# Patient Record
Sex: Male | Born: 1945 | Race: White | Hispanic: No | Marital: Married | Smoking: Former smoker
Health system: Southern US, Community
[De-identification: ages and names within clinical notes are randomized; demographics above are authoritative.]

## PROBLEM LIST (undated history)

## (undated) DIAGNOSIS — E46 Unspecified protein-calorie malnutrition: Secondary | ICD-10-CM

## (undated) DIAGNOSIS — I714 Abdominal aortic aneurysm, without rupture, unspecified: Secondary | ICD-10-CM

## (undated) DIAGNOSIS — C259 Malignant neoplasm of pancreas, unspecified: Secondary | ICD-10-CM

## (undated) DIAGNOSIS — H409 Unspecified glaucoma: Secondary | ICD-10-CM

## (undated) DIAGNOSIS — K5909 Other constipation: Secondary | ICD-10-CM

## (undated) DIAGNOSIS — I10 Essential (primary) hypertension: Secondary | ICD-10-CM

## (undated) HISTORY — DX: Unspecified glaucoma: H40.9

## (undated) HISTORY — DX: Unspecified protein-calorie malnutrition: E46

## (undated) HISTORY — PX: EYE SURGERY: SHX253

## (undated) HISTORY — DX: Malignant neoplasm of pancreas, unspecified: C25.9

## (undated) HISTORY — DX: Essential (primary) hypertension: I10

## (undated) HISTORY — DX: Other constipation: K59.09

## (undated) HISTORY — DX: Abdominal aortic aneurysm, without rupture: I71.4

## (undated) HISTORY — DX: Abdominal aortic aneurysm, without rupture, unspecified: I71.40

---

## 2016-03-10 ENCOUNTER — Encounter: Payer: Self-pay | Admitting: Gastroenterology

## 2016-03-28 ENCOUNTER — Inpatient Hospital Stay (HOSPITAL_COMMUNITY)
Admission: AD | Admit: 2016-03-28 | Discharge: 2016-04-01 | DRG: 435 | Disposition: A | Discharge status: Home / Self Care | Payer: Medicare HMO | Source: Other Acute Inpatient Hospital | Attending: Internal Medicine | Admitting: Internal Medicine

## 2016-03-28 ENCOUNTER — Encounter (HOSPITAL_COMMUNITY): Payer: Self-pay | Admitting: Internal Medicine

## 2016-03-28 DIAGNOSIS — K859 Acute pancreatitis without necrosis or infection, unspecified: Secondary | ICD-10-CM | POA: Diagnosis not present

## 2016-03-28 DIAGNOSIS — C259 Malignant neoplasm of pancreas, unspecified: Secondary | ICD-10-CM | POA: Diagnosis present

## 2016-03-28 DIAGNOSIS — R911 Solitary pulmonary nodule: Secondary | ICD-10-CM | POA: Diagnosis not present

## 2016-03-28 DIAGNOSIS — Z8249 Family history of ischemic heart disease and other diseases of the circulatory system: Secondary | ICD-10-CM

## 2016-03-28 DIAGNOSIS — N400 Enlarged prostate without lower urinary tract symptoms: Secondary | ICD-10-CM | POA: Diagnosis present

## 2016-03-28 DIAGNOSIS — H409 Unspecified glaucoma: Secondary | ICD-10-CM | POA: Diagnosis not present

## 2016-03-28 DIAGNOSIS — G479 Sleep disorder, unspecified: Secondary | ICD-10-CM | POA: Diagnosis present

## 2016-03-28 DIAGNOSIS — E860 Dehydration: Secondary | ICD-10-CM | POA: Diagnosis not present

## 2016-03-28 DIAGNOSIS — E440 Moderate protein-calorie malnutrition: Secondary | ICD-10-CM | POA: Diagnosis present

## 2016-03-28 DIAGNOSIS — K5730 Diverticulosis of large intestine without perforation or abscess without bleeding: Secondary | ICD-10-CM | POA: Diagnosis not present

## 2016-03-28 DIAGNOSIS — K769 Liver disease, unspecified: Secondary | ICD-10-CM | POA: Diagnosis not present

## 2016-03-28 DIAGNOSIS — I7409 Other arterial embolism and thrombosis of abdominal aorta: Secondary | ICD-10-CM | POA: Diagnosis present

## 2016-03-28 DIAGNOSIS — R5383 Other fatigue: Secondary | ICD-10-CM | POA: Diagnosis present

## 2016-03-28 DIAGNOSIS — Z882 Allergy status to sulfonamides status: Secondary | ICD-10-CM

## 2016-03-28 DIAGNOSIS — Z681 Body mass index (BMI) 19 or less, adult: Secondary | ICD-10-CM

## 2016-03-28 DIAGNOSIS — F419 Anxiety disorder, unspecified: Secondary | ICD-10-CM | POA: Diagnosis not present

## 2016-03-28 DIAGNOSIS — G893 Neoplasm related pain (acute) (chronic): Secondary | ICD-10-CM | POA: Diagnosis not present

## 2016-03-28 DIAGNOSIS — C787 Secondary malignant neoplasm of liver and intrahepatic bile duct: Secondary | ICD-10-CM | POA: Diagnosis present

## 2016-03-28 DIAGNOSIS — C7951 Secondary malignant neoplasm of bone: Secondary | ICD-10-CM | POA: Diagnosis present

## 2016-03-28 DIAGNOSIS — E785 Hyperlipidemia, unspecified: Secondary | ICD-10-CM | POA: Diagnosis not present

## 2016-03-28 DIAGNOSIS — Z823 Family history of stroke: Secondary | ICD-10-CM

## 2016-03-28 DIAGNOSIS — C7800 Secondary malignant neoplasm of unspecified lung: Secondary | ICD-10-CM | POA: Diagnosis present

## 2016-03-28 DIAGNOSIS — R634 Abnormal weight loss: Secondary | ICD-10-CM | POA: Diagnosis not present

## 2016-03-28 DIAGNOSIS — K5909 Other constipation: Secondary | ICD-10-CM | POA: Diagnosis present

## 2016-03-28 DIAGNOSIS — Z79899 Other long term (current) drug therapy: Secondary | ICD-10-CM | POA: Diagnosis not present

## 2016-03-28 DIAGNOSIS — R1013 Epigastric pain: Secondary | ICD-10-CM | POA: Diagnosis not present

## 2016-03-28 DIAGNOSIS — K869 Disease of pancreas, unspecified: Secondary | ICD-10-CM | POA: Diagnosis not present

## 2016-03-28 DIAGNOSIS — I10 Essential (primary) hypertension: Secondary | ICD-10-CM | POA: Diagnosis present

## 2016-03-28 DIAGNOSIS — I714 Abdominal aortic aneurysm, without rupture, unspecified: Secondary | ICD-10-CM | POA: Diagnosis present

## 2016-03-28 DIAGNOSIS — Z87891 Personal history of nicotine dependence: Secondary | ICD-10-CM

## 2016-03-28 DIAGNOSIS — K8689 Other specified diseases of pancreas: Secondary | ICD-10-CM | POA: Diagnosis present

## 2016-03-28 MED ORDER — BISACODYL 10 MG RE SUPP: 10.0000 mg | Freq: Every day | RECTAL | Status: DC | PRN

## 2016-03-28 MED ORDER — ONDANSETRON HCL 4 MG/2ML IJ SOLN: 4.0000 mg | Freq: Four times a day (QID) | INTRAMUSCULAR | Status: DC | PRN

## 2016-03-28 MED ORDER — POLYETHYLENE GLYCOL 3350 17 G PO PACK: 17.0000 g | PACK | Freq: Every day | ORAL | Status: DC | PRN

## 2016-03-28 MED ORDER — SODIUM CHLORIDE 0.9 % IV SOLN
INTRAVENOUS | Status: AC
  Administered 2016-03-29: 01:00:00 via INTRAVENOUS

## 2016-03-28 MED ORDER — SENNA 8.6 MG PO TABS
1.0000 | ORAL_TABLET | Freq: Two times a day (BID) | ORAL | Status: DC
  Administered 2016-03-29 – 2016-04-01 (×6): 8.6 mg via ORAL
  Filled 2016-03-28 (×6): qty 1

## 2016-03-28 MED ORDER — HYDROMORPHONE HCL 1 MG/ML IJ SOLN
0.5000 mg | INTRAMUSCULAR | Status: DC | PRN
  Administered 2016-03-29 – 2016-03-31 (×11): 0.5 mg via INTRAVENOUS
  Filled 2016-03-28 (×12): qty 1

## 2016-03-28 MED ORDER — LISINOPRIL 20 MG PO TABS
20.0000 mg | ORAL_TABLET | Freq: Every day | ORAL | Status: DC
  Administered 2016-03-29 – 2016-04-01 (×4): 20 mg via ORAL
  Filled 2016-03-28 (×4): qty 1

## 2016-03-28 MED ORDER — ACETAMINOPHEN 650 MG RE SUPP: 650.0000 mg | Freq: Four times a day (QID) | RECTAL | Status: DC | PRN

## 2016-03-28 MED ORDER — DORZOLAMIDE HCL 2 % OP SOLN
1.0000 [drp] | Freq: Three times a day (TID) | OPHTHALMIC | Status: DC
  Administered 2016-03-29 – 2016-04-01 (×11): 1 [drp] via OPHTHALMIC
  Filled 2016-03-28: qty 10

## 2016-03-28 MED ORDER — ONDANSETRON HCL 4 MG PO TABS
4.0000 mg | ORAL_TABLET | Freq: Four times a day (QID) | ORAL | Status: DC | PRN
  Filled 2016-03-28: qty 1

## 2016-03-28 MED ORDER — ACETAMINOPHEN 325 MG PO TABS: 650.0000 mg | ORAL_TABLET | Freq: Four times a day (QID) | ORAL | Status: DC | PRN

## 2016-03-28 NOTE — H&P (Signed)
Ricky Gay [id number] DOB: 03/22/1946 DOA: 03/28/2016     PCP: No primary care provider on file.   Outpatient Specialists: [facility] in [place]   Patient coming from:   home Lives   With family    Chief Complaint: Fatigue and weight loss significant abdominal pain  HPI: Ricky Gay is a 70 y.o. male with medical history significant of HTN, Glaucoma, hyperlipidemia    Presented with complaints of 2 months of weight loss worsening fatigue significant epigastric pain it has become so severe he presented to emergency department this has been worked up by colonoscopy which was negative. He originally was seen in [place] plain films was worrisome for constipation he was given medication for that but did not improve She endorses losing between 15-20 pounds over past 2 months. He presented to [facility]. 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 Bones (possibly other source) and lungs (single lung nodule). CT scan also showed a a a measuring 3.9 x 3.7 cm the posterior moderate thrombus. Vascular has been consulted Dr. Doren Custard stated is no current indication for anticoagulation. Patient was transferred to [facility] long for further workup and evaluation by oncology.      Hospitalist was called for admission for newly diagnosed pancreatic cancer diffuse metastases and new diagnosis of AAA with thrombosis  Review of Systems:    Pertinent positives include:  fatigue, weight loss   Constitutional:  No weight loss, night sweats, Fevers, chills, HEENT:  No headaches, Difficulty swallowing,Tooth/dental problems,Sore throat,  No sneezing, itching, ear ache, nasal congestion, post nasal drip,  Cardio-vascular:  No chest pain, Orthopnea, PND, anasarca, dizziness, palpitations.no Bilateral lower extremity swelling  GI:  No heartburn, indigestion, abdominal pain, nausea, vomiting, diarrhea, change in bowel habits,  loss of appetite, melena, blood in stool, hematemesis Resp:  no shortness of breath at rest. No dyspnea on exertion, No excess mucus, no productive cough, No non-productive cough, No coughing up of blood.No change in color of mucus.No wheezing. Skin:  no rash or lesions. No jaundice GU:  no dysuria, change in color of urine, no urgency or frequency. No straining to urinate.  No flank pain.  Musculoskeletal:  No joint pain or no joint swelling. No decreased range of motion. No back pain.  Psych:  No change in mood or affect. No depression or anxiety. No memory loss.  Neuro: no localizing neurological complaints, no tingling, no weakness, no double vision, no gait abnormality, no slurred speech, no confusion  As per HPI otherwise 10 point review of systems negative.   Past Medical History: No past medical history on file. No past surgical history on file.   Social History:  Ambulatory  Independently     has no tobacco, alcohol, and drug history on file.  Allergies:   Allergies  Allergen Reactions  . Bactrim [Sulfamethoxazole-Trimethoprim] Itching       Family History:    Family History  Problem Relation Age of Onset  . Dementia Mother   . CAD Father   . Stroke Father   . Cancer Other   . Diabetes Neg Hx     Medications: Prior to Admission medications   Medication Sig Start Date End Date Taking? Authorizing Provider  atorvastatin (LIPITOR) 10 MG tablet Take 10 mg by mouth daily. 01/22/16   Historical Provider, MD  dicyclomine (BENTYL) 10 MG capsule Take 10 mg by mouth 3 (three) times daily as needed. For abdominal  cramping? 03/14/16   Historical Provider, MD  dorzolamide (TRUSOPT) 2 % ophthalmic solution  03/27/16   Historical Provider, MD  lisinopril-hydrochlorothiazide (PRINZIDE,ZESTORETIC) 20-12.5 MG tablet Take 1 tablet by mouth daily. 01/22/16   Historical Provider, MD    Physical Exam: No data found.   1. General:  in No Acute distress 2. Psychological:  Alert and   Oriented 3. Head/ENT:     Dry Mucous Membranes                          Head Non traumatic, neck supple                            Poor Dentition 4. SKIN:   decreased Skin turgor,  Skin clean Dry and intact no rash 5. Heart: Regular rate and rhythm no Murmur, Rub or gallop 6. Lungs:  Clear to auscultation bilaterally, no wheezes or crackles   7. Abdomen: Soft,  Some epigastric tenderness, Non distended 8. Lower extremities: no clubbing, cyanosis, or edema 9. Neurologically Grossly intact, moving all 4 extremities equally 10. MSK: Normal range of motion   body mass index is unknown because there is no height or weight on file.  Labs on Admission:   Labs on Admission: I have personally reviewed following labs and imaging studies  CBC: No results for input(s): WBC, NEUTROABS, HGB, HCT, MCV, PLT in the last 168 hours. Basic Metabolic Panel: No results for input(s): NA, K, CL, CO2, GLUCOSE, BUN, CREATININE, CALCIUM, MG, PHOS in the last 168 hours. GFR: CrCl cannot be calculated (Unknown ideal weight.). Liver Function Tests: No results for input(s): AST, ALT, ALKPHOS, BILITOT, PROT, ALBUMIN in the last 168 hours. No results for input(s): LIPASE, AMYLASE in the last 168 hours. No results for input(s): AMMONIA in the last 168 hours. Coagulation Profile: No results for input(s): INR, PROTIME in the last 168 hours. Cardiac Enzymes: No results for input(s): CKTOTAL, CKMB, CKMBINDEX, TROPONINI in the last 168 hours. BNP (last 3 results) No results for input(s): PROBNP in the last 8760 hours. HbA1C: No results for input(s): HGBA1C in the last 72 hours. CBG: No results for input(s): GLUCAP in the last 168 hours. Lipid Profile: No results for input(s): CHOL, HDL, LDLCALC, TRIG, CHOLHDL, LDLDIRECT in the last 72 hours. Thyroid Function Tests: No results for input(s): TSH, T4TOTAL, FREET4, T3FREE, THYROIDAB in the last 72 hours. Anemia Panel: No results for input(s):  VITAMINB12, FOLATE, FERRITIN, TIBC, IRON, RETICCTPCT in the last 72 hours. Urine analysis: No results found for: COLORURINE, APPEARANCEUR, LABSPEC, PHURINE, GLUCOSEU, HGBUR, BILIRUBINUR, KETONESUR, PROTEINUR, UROBILINOGEN, NITRITE, LEUKOCYTESUR Sepsis Labs: @LABRCNTIP (procalcitonin:4,lacticidven:4) )No results found for this or any previous visit (from the past 240 hour(s)).   Labs from [place] glucose 102 creatinine 0.98 calcium 9.4 0.3 ALKALINE PHOSPHATASE 163 ALBUMIN 4.3 BILIRUBIN 0.8 SODIUM 133 ALT NORMAL POTASSIUM 3.6 ANION GAP OF 19 BICARBONATE 24.2, WBC 12 AND 0.5 HEMOGLOBIN 15.1 PLATELETS 182 UA no evidence of UTI  No results found for: HGBA1C  CrCl cannot be calculated (Unknown ideal weight.).  BNP (last 3 results) No results for input(s): PROBNP in the last 8760 hours.   ECG REPORT  Independently reviewed Rate: 77  Rhythm: Sinus rhythm with PACs incomplete left bundle branch block ST&T Change: No acute ischemic changes  QTC 472  There were no vitals filed for this visit.   Cultures: No results found for: [place], [place], [place], [place]  on Admission: CT at [place]: mass occupying body and tail of pancreas measuring 9.3 x 4.7 cm surrounding segment of's. Mesenteric artery widespread metastatic disease throughout the liver are just lesion measuring 5.3 x 4.8 cm. pancreatic mass extending to the splenic hilum but does not invade spleen. Aneurysmal dilatation of the distal abdominal elective maximum transverse diameter 3.9 x 3.7 cm moderate thrombus in the posterior periphery of aneurysm aneurysm terminates slightly above bifurcation arises slightly above renal arteries no evidence of rupture small sclerotic fossae at multiple sites in the pelvis and proximal femur O lead to 2 lesions small nodular lesion in the right lung base small metastatic focus small lesion in the posterior spleen potentially could represent small metastatic focus. Sclerotic  lesions in the bone more worrisome for possible prostate carcinoma Chart has been reviewed    Assessment/Plan   70 y.o. male with medical history significant of HTN, Glaucoma, hyperlipidemia here with nearly diagnosed pancreatic mass with metastases to liver and possibly spleen and lungs  Present on Admission:  . Pancreatic cancer metastasized to liver [facility]) - pancreatic mass most likely pancreatic cancer with evidence of large metastasis to the liver. Was transferred from [facility] for biopsy and further evaluation. Please call IR consult in a.m. to determine if biopsy would be possible meanwhile we will control pain.  Marland Kitchen HTN (hypertension) Indocin per hold hydrochlorothiazide patient has been dehydrated from not eating and drinking  . Abdominal pain, epigastric secondary to pancreatic mass most likely pain due to malignancy while nothing by mouth treated Dilaudid to see him to help the best for his pain   new diagnosis of AAA with partial thrombosis - vascular surgery does not feel that he needs anticoagulation at this point. Please call vascular surgeon the morning for full consult  Other plan as per orders.  DVT prophylaxis:  SCD    Code Status:  FULL CODE as per patient   Family Communication:   Family  at  Bedside  plan of care was discussed with   Son Sunil Zepp [phone], Daughter in [place] Aran [phone] ,  Wife Vivien Rota  phone Number [phone] Disposition Plan:    To home once workup is complete and patient is stable   Consults called: epic order for IR consult in place please call in AM    Admission status: inpatient     Level of care      medical floor        [place] 03/28/2016, 11:37 PM    [facility]  Pager [phone]   after 2 AM please page floor coverage PA If 7AM-7PM, please contact the day team taking care of the patient  [email or web address]  Password [email or web address]

## 2016-03-29 ENCOUNTER — Encounter (HOSPITAL_COMMUNITY): Payer: Self-pay | Admitting: Physician Assistant

## 2016-03-29 DIAGNOSIS — C787 Secondary malignant neoplasm of liver and intrahepatic bile duct: Secondary | ICD-10-CM

## 2016-03-29 DIAGNOSIS — I7410 Embolism and thrombosis of unspecified parts of aorta: Secondary | ICD-10-CM

## 2016-03-29 DIAGNOSIS — I10 Essential (primary) hypertension: Secondary | ICD-10-CM

## 2016-03-29 LAB — CBC
HCT: 40.8 % (ref 39.0–52.0)
Hemoglobin: 13.8 g/dL (ref 13.0–17.0)
MCH: 29.4 pg (ref 26.0–34.0)
MCHC: 33.8 g/dL (ref 30.0–36.0)
MCV: 87 fL (ref 78.0–100.0)
PLATELETS: 138 10*3/uL — AB (ref 150–400)
RBC: 4.69 MIL/uL (ref 4.22–5.81)
RDW: 13.1 % (ref 11.5–15.5)
WBC: 7.8 10*3/uL (ref 4.0–10.5)

## 2016-03-29 LAB — TSH: TSH: 2.981 u[IU]/mL (ref 0.350–4.500)

## 2016-03-29 LAB — COMPREHENSIVE METABOLIC PANEL
ALK PHOS: 122 U/L (ref 38–126)
ALT: 34 U/L (ref 17–63)
AST: 37 U/L (ref 15–41)
Albumin: 4 g/dL (ref 3.5–5.0)
Anion gap: 8 (ref 5–15)
BUN: 15 mg/dL (ref 6–20)
CALCIUM: 8.7 mg/dL — AB (ref 8.9–10.3)
CO2: 25 mmol/L (ref 22–32)
CREATININE: 0.96 mg/dL (ref 0.61–1.24)
Chloride: 100 mmol/L — ABNORMAL LOW (ref 101–111)
GFR calc non Af Amer: >60 mL/min (ref >60)
Glucose, Bld: 82 mg/dL (ref 65–99)
Potassium: 3.2 mmol/L — ABNORMAL LOW (ref 3.5–5.1)
SODIUM: 133 mmol/L — AB (ref 135–145)
Total Bilirubin: 0.8 mg/dL (ref 0.3–1.2)
Total Protein: 6.7 g/dL (ref 6.5–8.1)

## 2016-03-29 LAB — PROTIME-INR
INR: 1.27 (ref 0.00–1.49)
PROTHROMBIN TIME: 15.6 s — AB (ref 11.6–15.2)

## 2016-03-29 LAB — PHOSPHORUS: PHOSPHORUS: 3.4 mg/dL (ref 2.5–4.6)

## 2016-03-29 LAB — LIPASE, BLOOD: LIPASE: 58 U/L — AB (ref 11–51)

## 2016-03-29 LAB — MAGNESIUM: Magnesium: 1.8 mg/dL (ref 1.7–2.4)

## 2016-03-29 MED ORDER — CETYLPYRIDINIUM CHLORIDE 0.05 % MT LIQD
7.0000 mL | Freq: Two times a day (BID) | OROMUCOSAL | Status: DC
  Administered 2016-03-29 – 2016-04-01 (×7): 7 mL via OROMUCOSAL

## 2016-03-29 NOTE — Progress Notes (Signed)
Brief visit w/pt's wife, friends and son. Pt was unavailable (in restroom). Young offered a return visit and to request assistance if needed prior to next visit. Please page if support is needed.  [place], M.Div.   03/29/16 1200  Clinical Encounter Type  Visited With Family;Patient not available

## 2016-03-29 NOTE — H&P (Signed)
Chief Complaint: Fatigue Weight loss  Referring Physician(s): Toy Baker   Supervising Physician: Arne Cleveland  Patient Status: Inpatient  History of Present Illness: Ricky Gay is a 70 y.o. male who presented with complaints of 2 months of weight loss worsening fatigue.  He has had significant epigastric pain.    It became so severe that he presented to emergency department. Colonoscopy was negative.  He was originally seen in [place]. Abd films there were worrisome for constipation he was given medication for that but did not improve   He reports he has lost 15-20 pounds over past 2 months.   He then presented to [facility].   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    There is also some bony mets and enlarged prostate.   CT scan also showed an abdominal aortic aneurysm measuring 3.9 x 3.7 cm with moderate thrombus.   He was transferred to [facility] long for further workup and evaluation by oncology.  We are asked to evaluate him for image guided biopsy.  History reviewed. No pertinent past medical history.  History reviewed. No pertinent past surgical history.  Allergies: Bactrim  Medications: Prior to Admission medications   Medication Sig Start Date End Date Taking? Authorizing Provider  atorvastatin (LIPITOR) 10 MG tablet Take 10 mg by mouth daily. 01/22/16  Yes Historical Provider, MD  dicyclomine (BENTYL) 10 MG capsule Take 10 mg by mouth 3 (three) times daily as needed. For abdominal cramping 03/14/16  Yes Historical Provider, MD  dorzolamide (TRUSOPT) 2 % ophthalmic solution Place 1 drop into both eyes 3 (three) times daily.  03/27/16  Yes Historical Provider, MD  lisinopril-hydrochlorothiazide (PRINZIDE,ZESTORETIC) 20-12.5 MG tablet Take 1 tablet by mouth daily. 01/22/16  Yes Historical Provider, MD     Family History  Problem Relation Age of  Onset  . Dementia Mother   . CAD Father   . Stroke Father   . Cancer Other   . Diabetes Neg Hx     Social History   Social History  . Marital Status: Married    Spouse Name: N/A  . Number of Children: N/A  . Years of Education: N/A   Social History Main Topics  . Smoking status: Former Research scientist (life sciences)  . Smokeless tobacco: None  . Alcohol Use: Yes     Comment: once a week  . Drug Use: No  . Sexual Activity: Not Asked   Other Topics Concern  . None   Social History Narrative     Review of Systems: A 12 point ROS discussed  Review of Systems  Constitutional: Positive for activity change, appetite change, fatigue and unexpected weight change. Negative for fever and chills.  HENT: Negative.   Respiratory: Negative for cough, shortness of breath and wheezing.   Cardiovascular: Negative for chest pain.  Gastrointestinal: Positive for abdominal pain.  Genitourinary: Negative.   Musculoskeletal: Negative.   Skin: Negative.   Neurological: Negative.   Hematological: Negative.   Psychiatric/Behavioral: Negative.     Vital Signs: BP 135/74 mmHg  Pulse 53  Temp(Src) 98 F (36.7 C) (Oral)  Resp 18  Ht 5\' 8"  (1.727 m)  Wt 119 lb (53.978 kg)  BMI 18.10 kg/m2  SpO2 99%  Physical Exam  Constitutional: He is oriented to person, place, and time. He appears well-developed and well-nourished.  HENT:  Head: Normocephalic and atraumatic.  Eyes: EOM are normal.  Neck: Neck supple.  Cardiovascular: Normal rate, regular rhythm and normal heart sounds.   No murmur heard. Pulmonary/Chest: Effort normal and breath sounds normal.  Abdominal: Soft. Bowel sounds are normal. There is tenderness.  Musculoskeletal: Normal range of motion.  Neurological: He is alert and oriented to person, place, and time.  Skin: Skin is warm and dry.  Psychiatric: He has a normal mood and affect. His behavior is normal. Judgment and thought content normal.    Mallampati Score:  MD Evaluation Airway:  WNL Heart: WNL Abdomen: WNL Chest/ Lungs: WNL ASA  Classification: 3 Mallampati/Airway Score: One  Imaging: CLINICAL DATA: Abdominal pain  EXAM: CT ABDOMEN AND PELVIS WITH CONTRAST  TECHNIQUE: Multidetector CT imaging of the abdomen and pelvis was performed using the standard protocol following bolus administration of intravenous contrast. Oral contrast was also administered.  CONTRAST: 100 mL Isovue 370 nonionic  COMPARISON: None.  FINDINGS: Lower chest: On axial slice 16 series 5, there is a nodular opacity in the posterior segment of the right lower lobe measuring 6 x 5 mm. Lung bases otherwise are clear.  Hepatobiliary: Widespread metastatic disease is noted throughout the liver. Liver lesions range in size from as small as 4 mm to as large as 5.3 x 4.8 cm. This lesion is noted in the anterior segment of the right lobe. All lobes in segments of the liver are involved. The gallbladder wall is not appreciably thickened. There is no biliary duct dilatation.  Pancreas: There is a mass occupying most of the body and tail of the pancreas measuring 9.3 x 4.7 cm. The pancreatic head and uncinate process appear unremarkable. There is no appreciable pancreatic duct dilatation. There is mild mesenteric thickening adjacent to the body and tail the pancreas, suggesting that this mass has an inflammatory component. This mass surrounds a segment of the superior mesenteric artery.  Spleen: There is an area of decreased attenuation in the posterior spleen measuring 1.0 x 0.7 cm. No other focal splenic lesion is evident. Note that the mass arising from the pancreas extends to the splenic hilum but but does not frankly invade the spleen.  Adrenals/Urinary Tract: Adrenals appear unremarkable bilaterally. The pancreatic mass abuts the lateral left adrenal but does not overly invade the left adrenal. There is a 1.2 x 1.2 cm cyst in the medial upper pole right kidney. There is a  parapelvic cyst in the left kidney measuring 2.3 x 1.8 cm. There is no hydronephrosis on either side. There is a 1 mm calculus in the lower pole of the left kidney. No other renal calculi are identified. There is no ureteral calculus on either side. Urinary bladder is midline with wall thickness within normal limits.  Stomach/Bowel: There are multiple sigmoid diverticula without diverticulitis. There is no bowel wall or mesenteric thickening. No bowel obstruction. No free air or portal venous air.  Vascular/Lymphatic: There is aneurysmal dilatation of the distal abdominal aorta with a maximum transverse diameter of 3.9 x 3.7 cm. There is moderate thrombus in the posterior periphery of this aneurysm. The aneurysm terminates slightly above the bifurcation. It arises slightly above the renal arteries. There is no periaortic fluid. Vascular calcification is noted in both common and external iliac arteries. There is no adenopathy in the abdomen or pelvis.  Reproductive: Prostate is mildly enlarged. Seminal vesicles appear normal. No pelvic mass is identified. No pelvic fluid collection evident.  Other: There is no periappendiceal region inflammation. There is a metallic foreign body in the cecum. There is no abscess or ascites in  the abdomen or pelvis.  Musculoskeletal: There is degenerative change with disc space narrowing at L4-5. No lytic or destructive bone lesions are evident. Small sclerotic foci are noted at multiple sites in the pelvis and proximal femurs. No intramuscular or abdominal wall lesion.  IMPRESSION: Large pancreatic mass which inflitrates the pancreas and immediately adjacent structures. This lesion extends to the splenic hilum but does not frankly invade the spleen. This lesion extends to the level of the lateral left adrenal gland without adrenal gland invasion. There is no appreciable pancreatic duct dilatation. There are extensive liver metastases. A small  nodular lesion in the right lung base may represent a small metastatic focus. A small lesion in the posterior spleen potentially could represent a small metastatic focus as well.  Multiple small sclerotic foci in bone are noted. It is unusual for pancreatic carcinoma to present with sclerotic metastases. Prostate is enlarged, it is possible that there is prostate carcinoma present as well as pancreatic carcinoma.  Multiple sigmoid diverticular noted without diverticulitis. No bowel obstruction. No abscess.  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 There is associated atherosclerotic calcification in the aorta and iliac arteries.  No bowel obstruction. No abscess evident.   Electronically Signed By: Lowella Grip III M.D. On: 03/28/2016 14:44    Labs:  CBC:  Recent Labs  03/29/16 0426  WBC 7.8  HGB 13.8  HCT 40.8  PLT 138*    COAGS:  Recent Labs  03/29/16 0426  INR 1.27    BMP:  Recent Labs  03/29/16 0426  NA 133*  K 3.2*  CL 100*  CO2 25  GLUCOSE 82  BUN 15  CALCIUM 8.7*  CREATININE 0.96  GFRNONAA >60  GFRAA >60    LIVER FUNCTION TESTS:  Recent Labs  03/29/16 0426  BILITOT 0.8  AST 37  ALT 34  ALKPHOS 122  PROT 6.7  ALBUMIN 4.0    TUMOR MARKERS: No results for input(s): AFPTM, CEA, CA199, CHROMGRNA in the last 8760 hours.  Assessment and Plan:  Pancreatic mass with liver mets, lung nodule. Bone mets with enlarged prostate.  Will proceed with ultrasound guided biopsy of liver mass on Monday 03/31/2016.  Risks and Benefits discussed with the patient including, but not limited to bleeding, infection, damage to adjacent structures or low yield requiring additional tests.  All of the patient's questions were answered, patient is agreeable to proceed. Consent signed and in chart.  Thank you for this interesting consult.  I  greatly enjoyed meeting Ricky Gay and look forward to participating in their care.  A copy of this report was sent to the requesting provider on this date.  Electronically Signed: Murrell Redden PA-C 03/29/2016, 1:32 PM   I spent a total of 40 Minutes in face to face in clinical consultation, greater than 50% of which was counseling/coordinating care for image guided biopsy of liver mass.

## 2016-03-29 NOTE — Progress Notes (Signed)
PROGRESS NOTE  Bon Reason [id number] DOB: 1945/12/20 DOA: 03/28/2016 PCP: No primary care provider on file.  HPI/Recap of past 28 hours: 70 year old male with past mental history of hypertension and hyperlipidemia with 2 months of abdominal pain and weight loss presented to [facility] on 6/30 where CT scan revealed masses in body and tail of pancreas plus what looked to be liver metastases and also posterior thrombus in abdominal aortic aneurysm. Patient admitted for further workup and pain control.  Patient seen on floor. Midepigastric Pain starting to feel a little bit better. No shortness of breath or chest pain.   Assessment/Plan: Active Problems:   Pancreatic cancer metastasized to liver [facility]): Plan for interventional radiology for liver biopsy on Monday 7/3. In the interim, work on pain control. Nutrition to see given concerns for malnutrition.   HTN (hypertension): Blood pressure stable, continue home medications   Abdominal pain, epigastric: Likely secondary to intermittent episodes of pancreatitis. Lipase fortunately close to normal. Abdominal aortic aneurysm with posterior thrombus: Looks to be less than acute. We'll confirm with vascular surgery to his phone consulted last night and confirm patient does not need to be on anticoagulation   Code Status: Full code   Family Communication: Wife and son at the bedside   Disposition Plan: Likely here for several days pending results of biopsy    Consultants:  Interventional radiology   Procedures:  Planned liver biopsy   Antimicrobials:  None   DVT prophylaxis: SCDs   Objective: Filed Vitals:   03/28/16 2128 03/29/16 0512 03/29/16 1016  BP: 124/86 125/76 135/74  Pulse: 75 72 53  Temp: 97.8 F (36.6 C) 98 F (36.7 C)   TempSrc: Oral Oral   Resp: 20 18   Height: 5\' 8"  (1.727 m)    Weight: 53.978 kg (119 lb)    SpO2: 100% 99%     Intake/Output Summary (Last 24 hours) at 03/29/16 1236 Last data  filed at 03/29/16 0513  Gross per 24 hour  Intake    464 ml  Output    300 ml  Net    164 ml   Filed Weights   03/28/16 2128  Weight: 53.978 kg (119 lb)    Exam:   General:  Alert and oriented 3, no acute distress   Cardiovascular: Regular rate and rhythm, S1-S2   Respiratory: Clear to auscultation bilaterally   Abdomen: Mild midepigastric tenderness, otherwise positive bowel sounds, non-stented, nontender   Musculoskeletal: No clubbing or cyanosis or edema   Skin: No skin breaks, tears or lesions  Psychiatry: Patient is appropriate, no evidence of psychoses    Data Reviewed: CBC:  Recent Labs Lab 03/29/16 0426  WBC 7.8  HGB 13.8  HCT 40.8  MCV 87.0  PLT 0000000*   Basic Metabolic Panel:  Recent Labs Lab 03/29/16 0426  NA 133*  K 3.2*  CL 100*  CO2 25  GLUCOSE 82  BUN 15  CREATININE 0.96  CALCIUM 8.7*  MG 1.8  PHOS 3.4   GFR: Estimated Creatinine Clearance: 55.5 mL/min (by C-G formula based on Cr of 0.96). Liver Function Tests:  Recent Labs Lab 03/29/16 0426  AST 37  ALT 34  ALKPHOS 122  BILITOT 0.8  PROT 6.7  ALBUMIN 4.0    Recent Labs Lab 03/29/16 0426  LIPASE 58*   No results for input(s): AMMONIA in the last 168 hours. Coagulation Profile:  Recent Labs Lab 03/29/16 0426  INR 1.27   Cardiac Enzymes: No results for input(s): CKTOTAL,  CKMB, CKMBINDEX, TROPONINI in the last 168 hours. BNP (last 3 results) No results for input(s): PROBNP in the last 8760 hours. HbA1C: No results for input(s): HGBA1C in the last 72 hours. CBG: No results for input(s): GLUCAP in the last 168 hours. Lipid Profile: No results for input(s): CHOL, HDL, LDLCALC, TRIG, CHOLHDL, LDLDIRECT in the last 72 hours. Thyroid Function Tests:  Recent Labs  03/29/16 0426  TSH 2.981   Anemia Panel: No results for input(s): VITAMINB12, FOLATE, FERRITIN, TIBC, IRON, RETICCTPCT in the last 72 hours. Urine analysis: No results found for: COLORURINE,  APPEARANCEUR, LABSPEC, PHURINE, GLUCOSEU, HGBUR, BILIRUBINUR, KETONESUR, PROTEINUR, UROBILINOGEN, NITRITE, LEUKOCYTESUR Sepsis Labs: @LABRCNTIP (procalcitonin:4,lacticidven:4)  )No results found for this or any previous visit (from the past 240 hour(s)).    Studies: No results found.  Scheduled Meds: . antiseptic oral rinse  7 mL Mouth Rinse BID  . dorzolamide  1 drop Both Eyes TID  . lisinopril  20 mg Oral Daily  . senna  1 tablet Oral BID    Continuous Infusions:    LOS: 1 day   Time spent: 15 minutes  [name], MD [facility] Pager [phone]  If 7PM-7AM, please contact night-coverage [email or web address] Password [facility] 03/29/2016, 12:36 PM

## 2016-03-29 NOTE — Progress Notes (Signed)
Patient arrived on then unit at approximately 2113. He ids alert and verbally responsive and voiced no complaints of pain. Patient made comfortable and is awaiting visit from admitting MD.

## 2016-03-30 DIAGNOSIS — G479 Sleep disorder, unspecified: Secondary | ICD-10-CM

## 2016-03-30 DIAGNOSIS — T402X5A Adverse effect of other opioids, initial encounter: Secondary | ICD-10-CM

## 2016-03-30 DIAGNOSIS — K5903 Drug induced constipation: Secondary | ICD-10-CM

## 2016-03-30 LAB — PSA: PSA: 2.3 ng/mL (ref 0.00–4.00)

## 2016-03-30 LAB — CANCER ANTIGEN 19-9: CA 19 9: 11274 U/mL — AB (ref 0–35)

## 2016-03-30 LAB — CA 125: CA 125: 164.4 U/mL

## 2016-03-30 MED ORDER — DIPHENHYDRAMINE HCL 25 MG PO CAPS
25.0000 mg | ORAL_CAPSULE | Freq: Four times a day (QID) | ORAL | Status: DC | PRN
  Administered 2016-03-30 – 2016-03-31 (×2): 25 mg via ORAL
  Filled 2016-03-30 (×3): qty 1

## 2016-03-30 MED ORDER — ZOLPIDEM TARTRATE 5 MG PO TABS
5.0000 mg | ORAL_TABLET | Freq: Every day | ORAL | Status: DC
  Administered 2016-03-30 – 2016-03-31 (×2): 5 mg via ORAL
  Filled 2016-03-30 (×2): qty 1

## 2016-03-30 NOTE — Progress Notes (Signed)
Initial Nutrition Assessment  DOCUMENTATION CODES:   Non-severe (moderate) malnutrition in context of chronic illness  INTERVENTION:  -Boost Plus TID, each supplement provides 360 calories and 14 grams of protein -RD to continue to monitor  NUTRITION DIAGNOSIS:   Malnutrition related to chronic illness as evidenced by moderate depletion of body fat, moderate depletions of muscle mass.  GOAL:   Patient will meet greater than or equal to 90% of their needs  MONITOR:   PO intake, I & O's, Skin, Labs, Supplement acceptance  REASON FOR ASSESSMENT:   Consult, Malnutrition Screening Tool Assessment of nutrition requirement/status  ASSESSMENT:   Ricky Gay is a 70 y.o. male who presented with complaints of 2 months of weight loss worsening fatigue.  Spoke with Ricky Gay, family at bedside. He had just completed a tray during my visit that he had completed 100% Documented PO intake 100% for 3 out of 4 meals thus far. Family states PTA he was eating poorly for approximately 1 month. States that pain is his major concern when it comes to PO intake, he doesn't eat much with high levels of pain.  He did not consume oral nutrition supplements at home.   Nutrition-Focused physical exam completed. Findings are moderate-severe fat depletion, moderate-severe muscle depletion, and no edema.   Endorses 15-20# wt loss in 2 months. Reported wt loss would be a 15#/11%-20#/14% severe wt loss  Patient was open to try boost, encouraged him to consume at home in addition to soups, milk, and any other liquids he prefers to maintain weight with increased energy needs from masses.  Labs and Medications reviewed: Na 133, K 3.2 Senokot   Diet Order:  Diet 2 gram sodium Room service appropriate?: Yes; Fluid consistency:: Thin Diet NPO time specified Except for: Sips with Meds  Skin:  Reviewed, no issues  Last BM:  7/1  Height:   Ht Readings from Last 1 Encounters:  03/28/16 5\' 8"  (1.727  m)    Weight:   Wt Readings from Last 1 Encounters:  03/28/16 119 lb (53.978 kg)    Ideal Body Weight:  70 kg  BMI:  Body mass index is 18.1 kg/(m^2).  Estimated Nutritional Needs:   Kcal:  1620-1900 calories  Protein:  65-80 grams  Fluid:  >/= 1.6 L  EDUCATION NEEDS:   No education needs identified at this time  Satira Anis. Seldon Barrell, MS, RD LDN Inpatient Clinical Dietitian Pager [phone]

## 2016-03-30 NOTE — Progress Notes (Signed)
PROGRESS NOTE  Ricky Gay [id number] DOB: September 07, 1946 DOA: 03/28/2016 PCP: No primary care provider on file.  HPI/Recap of past 57 hours: 70 year old male with past mental history of hypertension and hyperlipidemia with 2 months of abdominal pain and weight loss presented to [facility] on 6/30 where CT scan revealed masses in body and tail of pancreas plus what looked to be liver metastases and also posterior thrombus in abdominal aortic aneurysm. Patient admitted for further workup and pain control.  Pain better managed with IV Dilaudid. Some secondary itching. Difficulty moving bowels although good response today with medication. Did not sleep well last night  Assessment/Plan: Active Problems:   Pancreatic cancer metastasized to liver [facility]): Plan for interventional radiology for liver biopsy on Monday 7/3. Change to oral pain control post biopsy  Moderate malnutrition: Patient meets criteria in the context of chronic illness. Seen by nutrition. On boost +3 times a day.    HTN (hypertension): Blood pressure stable, continue home medications   Abdominal pain, epigastric: Likely secondary to intermittent episodes of pancreatitis. Lipase fortunately close to normal.  Abdominal aortic aneurysm with posterior thrombus: Looks to be less than acute. Discussed with vascular surgery who at this time recommend no need for anticoagulation  Sleep disturbance: We'll try when necessary Ambien  Constipation: Worsening with opioids, although patient was having issues prior to hospitalization. Some of this may be from dehydration from poor by mouth intake. Continue current bowel regimen with plans to expand with goal of one bowel movement every 24 hours  Code Status: Full code   Family Communication: Wife and son at the bedside   Disposition Plan: Hopefully in 2 days if we can get pathology and care plan in place   Consultants:  Interventional radiology   Procedures:  Planned  liver biopsy -7/3  Antimicrobials:  None   DVT prophylaxis: SCDs   Objective: Filed Vitals:   03/29/16 2043 03/30/16 0546 03/30/16 0945 03/30/16 1505  BP: 157/80 132/98 141/73 135/84  Pulse: 68 72  77  Temp: 97.7 F (36.5 C) 97.8 F (36.6 C)  97.5 F (36.4 C)  TempSrc: Oral Oral  Oral  Resp: 16 16  16   Height:      Weight:      SpO2: 100% 100%  100%    Intake/Output Summary (Last 24 hours) at 03/30/16 1610 Last data filed at 03/30/16 1301  Gross per 24 hour  Intake   1315 ml  Output   1000 ml  Net    315 ml   Filed Weights   03/28/16 2128  Weight: 53.978 kg (119 lb)    Exam:   General:  Alert and oriented 3, no acute distress   Cardiovascular: Regular rate and rhythm, S1-S2   Respiratory: Clear to auscultation bilaterally   Abdomen: Non- tenderness, otherwise positive bowel sounds, nondistended, soft  Musculoskeletal: No clubbing or cyanosis or edema   Skin: No skin breaks, tears or lesions  Psychiatry: Patient is appropriate, no evidence of psychoses    Data Reviewed: CBC:  Recent Labs Lab 03/29/16 0426  WBC 7.8  HGB 13.8  HCT 40.8  MCV 87.0  PLT 0000000*   Basic Metabolic Panel:  Recent Labs Lab 03/29/16 0426  NA 133*  K 3.2*  CL 100*  CO2 25  GLUCOSE 82  BUN 15  CREATININE 0.96  CALCIUM 8.7*  MG 1.8  PHOS 3.4   GFR: Estimated Creatinine Clearance: 55.5 mL/min (by C-G formula based on Cr of 0.96). Liver Function Tests:  Recent Labs Lab 03/29/16 0426  AST 37  ALT 34  ALKPHOS 122  BILITOT 0.8  PROT 6.7  ALBUMIN 4.0    Recent Labs Lab 03/29/16 0426  LIPASE 58*   No results for input(s): AMMONIA in the last 168 hours. Coagulation Profile:  Recent Labs Lab 03/29/16 0426  INR 1.27   Cardiac Enzymes: No results for input(s): CKTOTAL, CKMB, CKMBINDEX, TROPONINI in the last 168 hours. BNP (last 3 results) No results for input(s): PROBNP in the last 8760 hours. HbA1C: No results for input(s): HGBA1C in the last  72 hours. CBG: No results for input(s): GLUCAP in the last 168 hours. Lipid Profile: No results for input(s): CHOL, HDL, LDLCALC, TRIG, CHOLHDL, LDLDIRECT in the last 72 hours. Thyroid Function Tests:  Recent Labs  03/29/16 0426  TSH 2.981   Anemia Panel: No results for input(s): VITAMINB12, FOLATE, FERRITIN, TIBC, IRON, RETICCTPCT in the last 72 hours. Urine analysis: No results found for: COLORURINE, APPEARANCEUR, LABSPEC, PHURINE, GLUCOSEU, HGBUR, BILIRUBINUR, KETONESUR, PROTEINUR, UROBILINOGEN, NITRITE, LEUKOCYTESUR Sepsis Labs: @LABRCNTIP (procalcitonin:4,lacticidven:4)  )No results found for this or any previous visit (from the past 240 hour(s)).    Studies: No results found.  Scheduled Meds: . antiseptic oral rinse  7 mL Mouth Rinse BID  . dorzolamide  1 drop Both Eyes TID  . lisinopril  20 mg Oral Daily  . senna  1 tablet Oral BID  . zolpidem  5 mg Oral QHS    Continuous Infusions:    LOS: 2 days   Time spent: 15 minutes  [name], MD [facility] Pager [phone]  If 7PM-7AM, please contact night-coverage [email or web address] Password [email or web address] 03/30/2016, 4:10 PM

## 2016-03-30 NOTE — Progress Notes (Signed)
Pt was sitting bedside when I arrived. He shared his feelings of being scared, but wanting to find out the answers in order to begin a plan of care. Pt talked the support of his family and friends. He said his family and friends are all praying, as well as himself. Pt said he was feeling fine and then all of a sudden he wasn't. Pt wanted prayer. [place] encouraged him to request support when needed. Please page [facility] for additional support. [place], M.Div.   03/30/16 1700  Clinical Encounter Type  Visited With Patient

## 2016-03-31 ENCOUNTER — Inpatient Hospital Stay (HOSPITAL_COMMUNITY): Payer: Medicare HMO

## 2016-03-31 ENCOUNTER — Other Ambulatory Visit: Payer: Self-pay

## 2016-03-31 ENCOUNTER — Encounter (HOSPITAL_COMMUNITY): Payer: Self-pay | Admitting: Radiology

## 2016-03-31 DIAGNOSIS — K769 Liver disease, unspecified: Secondary | ICD-10-CM

## 2016-03-31 DIAGNOSIS — N400 Enlarged prostate without lower urinary tract symptoms: Secondary | ICD-10-CM

## 2016-03-31 DIAGNOSIS — K869 Disease of pancreas, unspecified: Secondary | ICD-10-CM

## 2016-03-31 DIAGNOSIS — R911 Solitary pulmonary nodule: Secondary | ICD-10-CM

## 2016-03-31 DIAGNOSIS — I714 Abdominal aortic aneurysm, without rupture: Secondary | ICD-10-CM

## 2016-03-31 DIAGNOSIS — R1013 Epigastric pain: Secondary | ICD-10-CM

## 2016-03-31 DIAGNOSIS — M899 Disorder of bone, unspecified: Secondary | ICD-10-CM

## 2016-03-31 DIAGNOSIS — R634 Abnormal weight loss: Secondary | ICD-10-CM

## 2016-03-31 LAB — CEA: CEA: 21.6 ng/mL — ABNORMAL HIGH (ref 0.0–4.7)

## 2016-03-31 LAB — CANCER ANTIGEN 19-9: CA 19-9: 10139 U/mL — ABNORMAL HIGH (ref 0–35)

## 2016-03-31 MED ORDER — FENTANYL CITRATE (PF) 100 MCG/2ML IJ SOLN
INTRAMUSCULAR | Status: AC | PRN
  Administered 2016-03-31 (×2): 25 ug via INTRAVENOUS
  Administered 2016-03-31: 50 ug via INTRAVENOUS

## 2016-03-31 MED ORDER — FENTANYL CITRATE (PF) 100 MCG/2ML IJ SOLN
INTRAMUSCULAR | Status: AC
  Filled 2016-03-31: qty 4

## 2016-03-31 MED ORDER — MORPHINE SULFATE (CONCENTRATE) 10 MG/0.5ML PO SOLN
10.0000 mg | ORAL | Status: DC | PRN
  Administered 2016-03-31: 10 mg via ORAL
  Filled 2016-03-31: qty 0.5

## 2016-03-31 MED ORDER — MIDAZOLAM HCL 2 MG/2ML IJ SOLN
INTRAMUSCULAR | Status: AC | PRN
  Administered 2016-03-31 (×4): 1 mg via INTRAVENOUS

## 2016-03-31 MED ORDER — MIDAZOLAM HCL 2 MG/2ML IJ SOLN
INTRAMUSCULAR | Status: AC
  Filled 2016-03-31: qty 6

## 2016-03-31 MED ORDER — MORPHINE SULFATE (CONCENTRATE) 10 MG/0.5ML PO SOLN
20.0000 mg | ORAL | Status: DC | PRN
  Administered 2016-03-31 – 2016-04-01 (×4): 20 mg via ORAL
  Filled 2016-03-31 (×5): qty 1

## 2016-03-31 MED ORDER — HYDROMORPHONE HCL 1 MG/ML IJ SOLN
0.2500 mg | INTRAMUSCULAR | Status: DC | PRN
  Administered 2016-04-01: 0.25 mg via INTRAVENOUS
  Filled 2016-03-31: qty 1

## 2016-03-31 MED ORDER — IOPAMIDOL (ISOVUE-300) INJECTION 61%
75.0000 mL | Freq: Once | INTRAVENOUS | Status: AC | PRN
  Administered 2016-03-31: 75 mL via INTRAVENOUS

## 2016-03-31 NOTE — Consult Note (Signed)
[place]  [phone]   HEMATOLOGY ONCOLOGY INPATIENT CONSULTATION   Ricky Gay  DOB: 1946/09/03  MR#: [id number]  CSN#: [id number]    Requesting Physician: [facility]  No care team member to display  Reason for consult: pancreatic and liver masses  History of present illness:   Ricky Gay is a 70 year old gentleman with past medical history of hypertension, glaucoma, hyperlipidemia, otherwise healthy and fit, presented with intermittent and a worsening epigastric pain 4 months. His pain gets worse after meals, and he has been eating less, he lost about 15 pounds in the past 2 months. He has moderate fatigue, much less active lately. But able to function at home. He initially was seen by primary care physician, underwent EGD which was negative. He presented to [facility], CT scan of abdomen and pelvis showed a large pelvic lytic lesion with multiple liver metastasis. He was transferred to [facility] for further workup. He underwent ultrasound-guided liver biopsy this morning, tolerated the procedure well. He was not taking any necrotic the pain medication before hospitalization, and his pain has improved with pain metastases in the hospital.  MEDICAL HISTORY:  Hypertension Hyperlipidemia Glaucoma  SURGICAL HISTORY: History reviewed. No pertinent past surgical history.  SOCIAL HISTORY: Social History   Social History  . Marital Status: Married    Spouse Name: N/A  . Number of Children: N/A  . Years of Education: N/A   Occupational History  . Not on file.   Social History Main Topics  . Smoking status: Former Research scientist (life sciences)  . Smokeless tobacco: Not on file  . Alcohol Use: Yes     Comment: once a week  . Drug Use: No  . Sexual Activity: Not on file   Other Topics Concern  . Not on file   Social History Narrative    FAMILY HISTORY: Family History  Problem Relation Age of Onset  . Dementia Mother   . CAD Father   . Stroke  Father   . Cancer Other   . Diabetes Neg Hx     ALLERGIES:  is allergic to bactrim.  MEDICATIONS:  Current Facility-Administered Medications  Medication Dose Route Frequency Provider Last Rate Last Dose  . acetaminophen (TYLENOL) tablet 650 mg  650 mg Oral Q6H PRN Toy Baker, MD       Or  . acetaminophen (TYLENOL) suppository 650 mg  650 mg Rectal Q6H PRN Toy Baker, MD      . antiseptic oral rinse (CPC / CETYLPYRIDINIUM CHLORIDE 0.05%) solution 7 mL  7 mL Mouth Rinse BID Toy Baker, MD   7 mL at 03/31/16 1000  . bisacodyl (DULCOLAX) suppository 10 mg  10 mg Rectal Daily PRN Toy Baker, MD      . diphenhydrAMINE (BENADRYL) capsule 25 mg  25 mg Oral Q6H PRN [name], MD   25 mg at 03/31/16 0740  . dorzolamide (TRUSOPT) 2 % ophthalmic solution 1 drop  1 drop Both Eyes TID Toy Baker, MD   1 drop at 03/31/16 1600  . HYDROmorphone (DILAUDID) injection 0.25 mg  0.25 mg Intravenous Q3H PRN [name], MD      . lisinopril (PRINIVIL,ZESTRIL) tablet 20 mg  20 mg Oral Daily Toy Baker, MD   20 mg at 03/31/16 1034  . morphine CONCENTRATE 10 MG/0.5ML oral solution 20 mg  20 mg Oral Q3H PRN [name], MD   20 mg at 03/31/16 1708  . ondansetron (ZOFRAN) tablet 4 mg  4 mg  Oral Q6H PRN Toy Baker, MD       Or  . ondansetron (ZOFRAN) injection 4 mg  4 mg Intravenous Q6H PRN Toy Baker, MD      . polyethylene glycol (MIRALAX / GLYCOLAX) packet 17 g  17 g Oral Daily PRN Toy Baker, MD      . senna (SENOKOT) tablet 8.6 mg  1 tablet Oral BID Toy Baker, MD   8.6 mg at 03/31/16 1034  . zolpidem (AMBIEN) tablet 5 mg  5 mg Oral QHS [name], MD   5 mg at 03/30/16 2127    REVIEW OF SYSTEMS:   Constitutional: Denies fevers, chills or abnormal night sweats Eyes: Denies blurriness of vision, double vision or watery eyes Ears, nose, mouth, throat, and face: Denies mucositis or sore throat Respiratory:  Denies cough, dyspnea or wheezes Cardiovascular: Denies palpitation, chest discomfort or lower extremity swelling Gastrointestinal:  Denies nausea, heartburn or change in bowel habits Skin: Denies abnormal skin rashes Lymphatics: Denies new lymphadenopathy or easy bruising Neurological:Denies numbness, tingling or new weaknesses Behavioral/Psych: Mood is stable, no new changes  All other systems were reviewed with the patient and are negative.  PHYSICAL EXAMINATION: ECOG PERFORMANCE STATUS: 2 - Symptomatic, <50% confined to bed  Filed Vitals:   03/31/16 0935 03/31/16 1427  BP: 162/94 128/75  Pulse: 77 74  Temp:  97.7 F (36.5 C)  Resp: 17 16   Filed Weights   03/28/16 2128  Weight: 119 lb (53.978 kg)    GENERAL:alert, no distress and comfortable SKIN: skin color, texture, turgor are normal, no rashes or significant lesions EYES: normal, conjunctiva are pink and non-injected, sclera clear OROPHARYNX:no exudate, no erythema and lips, buccal mucosa, and tongue normal  NECK: supple, thyroid normal size, non-tender, without nodularity LYMPH:  no palpable lymphadenopathy in the cervical, axillary or inguinal LUNGS: clear to auscultation and percussion with normal breathing effort HEART: regular rate & rhythm and no murmurs and no lower extremity edema ABDOMEN:abdomen soft, non-tender and normal bowel sounds Musculoskeletal:no cyanosis of digits and no clubbing  PSYCH: alert & oriented x 3 with fluent speech NEURO: no focal motor/sensory deficits  LABORATORY DATA:  I have reviewed the data as listed Lab Results  Component Value Date   WBC 7.8 03/29/2016   HGB 13.8 03/29/2016   HCT 40.8 03/29/2016   MCV 87.0 03/29/2016   PLT 138* 03/29/2016    Recent Labs  03/29/16 0426  NA 133*  K 3.2*  CL 100*  CO2 25  GLUCOSE 82  BUN 15  CREATININE 0.96  CALCIUM 8.7*  GFRNONAA >60  GFRAA >60  PROT 6.7  ALBUMIN 4.0  AST 37  ALT 34  ALKPHOS 122  BILITOT 0.8   Results for  Ricky Gay, Ricky Gay (MRN [id number]) as of 03/31/2016 17:57  Ref. Range 03/29/2016 04:26 03/30/2016 10:56  CA 19-9 Latest Ref Range: 0-35 U/mL 11274 (H) 10139 (H)  CA 125 Latest Ref Range: Not Estab. U/mL 164.4   CEA Latest Ref Range: 0.0-4.7 ng/mL  21.6 (H)  PSA Latest Ref Range: 0.00-4.00 ng/mL  2.30   RADIOGRAPHIC STUDIES: I have personally reviewed the radiological images as listed and agreed with the findings in the report. US Biopsy  03/31/2016  INDICATION: 70 year old male within imaging diagnosis of metastatic pancreatic carcinoma. EXAM: ULTRASOUND BIOPSY CORE LIVER MEDICATIONS: None. ANESTHESIA/SEDATION: Moderate (conscious) sedation was employed during this procedure. A total of Versed 4.0 mg and Fentanyl 100 mcg was administered intravenously. Moderate Sedation Time: 14 minutes. The patient's  level of consciousness and vital signs were monitored continuously by radiology nursing throughout the procedure under my direct supervision. FLUOROSCOPY TIME:  None COMPLICATIONS: None PROCEDURE: Informed written consent was obtained from the patient after a thorough discussion of the procedural risks, benefits and alternatives. All questions were addressed. Maximal Sterile Barrier Technique was utilized including caps, mask, sterile gowns, sterile gloves, sterile drape, hand hygiene and skin antiseptic. A timeout was performed prior to the initiation of the procedure. Ultrasound survey of the abdomen was performed with images stored and sent to PACs. Once the turgor was determined, the patient is prepped and draped in usual sterile fashion using chlorhexidine prep. The skin and subcutaneous tissues were generously infiltrated 1% lidocaine for local anesthesia. A small stab incision was made with 11 blade scalpel. Using ultrasound guidance, needle was advanced in the hypoechoic lesion of the right liver lobe, segment 6, with 4 separate 18 gauge core specimen retrieved and placed in solution. Gel-Foam pledgets were  infused.  Final image was recorded. Patient tolerated the procedure well and remained hemodynamically stable throughout. No complications were encountered and no significant blood loss encountered. IMPRESSION: Status post ultrasound-guided biopsy of right liver lobe mass. Tissue specimen sent to pathology complete histopathologic analysis. Signed, Dulcy Fanny. Earleen Newport, DO [facility] Electronically Signed   By: Corrie Mckusick D.O.   On: 03/31/2016 10:42   Ct Outside Films Body  03/31/2016  CLINICAL DATA:  This exam is stored here for comparison purposes only and was performed at an outside facility.   Please contact the originating institution for any associated interpretation or report.   IMPRESSION: Large pancreatic mass which inflitrates the pancreas and immediately adjacent structures. This lesion extends to the splenic hilum but does not frankly invade the spleen. This lesion extends to the level of the lateral left adrenal gland without adrenal gland invasion. There is no appreciable pancreatic duct dilatation. There are extensive liver metastases. A small nodular lesion in the right lung base may represent a small metastatic focus. A small lesion in the posterior spleen potentially could represent a small metastatic focus as well.  Multiple small sclerotic foci in bone are noted. It is unusual for pancreatic carcinoma to present with sclerotic metastases. Prostate is enlarged, it is possible that there is prostate carcinoma present as well as pancreatic carcinoma.  Multiple sigmoid diverticular noted without diverticulitis. No bowel obstruction. No abscess.  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 There is associated atherosclerotic calcification in the aorta and iliac arteries.  No bowel obstruction. No abscess  evident.  ASSESSMENT & PLAN:  70 year old Caucasian male, with past medical history of hypertension, otherwise pretty healthy and fit, presented with intermittent epigastric pain and weight loss for one month.   1. Large pancreatic mass in body/tail, with multiple liver lesions, probable metastatic pancreatic cancer 2. Abdominal pain secondary to #1 3. AAA with small thrombus  4. Lung nodule and sclerotic lesion, rule out metastasis   Recommendations: -I asked [facility] radiology to upload his outside CT scan to our system so we can review -is CT scan findings, along with significantly elevated CA 19.9, is highly suspicious for metastatic pancreatic cancer to liver, especially adenocarcinoma.  -He does have enlarged prostate, but asymptomatic, PSA is normal, I do not think this is prostate cancer. -Liver biopsy pathology results still pending, I'll give him a call when the results returns. -  I recommend CT chest and bone scan to complete staging, before discharge or as outpt  -We discussed that this is likely incurable malignancy, and the overall prognosis is very poor if it is pancreatic adenocarcinoma -We discussed the treatment options for pancreatic adenocarcinoma, which will be systemic chemotherapy, such as gemcitabine and Abraxane.  -He lives in [place], I will refer him to see my partner Dr. Whitney Muse at [facility]. Her office will call pt for appointment  -OK to discharge from oncology standpoint, if his pain is controlled and no other active issues  All questions were answered. The patient knows to call the clinic with any problems, questions or concerns.  Thanks for the opportunity to participate in this gentlemen's care.     Truitt Merle, MD 03/31/2016 5:45 PM

## 2016-03-31 NOTE — Procedures (Signed)
Interventional Radiology Procedure Note  Procedure: US guided biopsy of right liver lobe mass.  4 x 18G core bx.  Complications: None Recommendations:  - Ok to shower tomorrow - Do not submerge for 7 days - Routine wound care - Follow up pathology   Signed,  Dulcy Fanny. Earleen Newport, DO

## 2016-03-31 NOTE — Progress Notes (Signed)
   03/31/16 1200  Clinical Encounter Type  Visited With Patient and family together  Visit Type Initial;Psychological support;Spiritual support  Referral From Chaplain  Consult/Referral To Chaplain  Spiritual Encounters  Spiritual Needs Prayer;Emotional;Other (Comment) (Pastoral Conversation/Support)  Stress Factors  Patient Stress Factors Health changes  Family Stress Factors Health changes   I visited with the patient per referral by the nighttime Chaplain who had visited with the patient previously. The patient was sitting up in the bed upon my arrival and his family were in the room.  The patient requested prayer for his current health condition and for the treatments that he will have to receive.  The patient stated that he appreciates Spiritual Care's support and that if he has to come back to the hospital that he would like visits.  We prayed together.   Please contact Spiritual [facility] for further assistance.   [place] M.Div.

## 2016-03-31 NOTE — Progress Notes (Signed)
PROGRESS NOTE  Ricky Gay [id number] DOB: 11/20/1945 DOA: 03/28/2016 PCP: No primary care provider on file.  HPI/Recap of past 15 hours: 70 year old male with past mental history of hypertension and hyperlipidemia with 2 months of abdominal pain and weight loss presented to [facility] on 6/30 where CT scan revealed masses in body and tail of pancreas plus what looked to be liver metastases and also posterior thrombus in abdominal aortic aneurysm. Patient admitted for further workup and pain control.  Patient seen after liver biopsy today. Slightly groggy from anesthesia. Pain controlled.  Assessment/Plan: Active Problems: Suspected  Pancreatic cancer metastasized to liver [facility]): Plan for interventional radiology for liver biopsy on Monday 7/3. We'll try to change IV Dilaudid to by mouth Roxanol for conversion. Awaiting pathology, discussed with path and they will have results by tomorrow.  Moderate malnutrition: Patient meets criteria in the context of chronic illness. Seen by nutrition. On boost +3 times a day.    HTN (hypertension): Blood pressure stable, continue home medications   Abdominal pain, epigastric: Likely secondary to intermittent episodes of pancreatitis. Lipase fortunately close to normal.  Abdominal aortic aneurysm with posterior thrombus: Looks to be less than acute. Discussed with vascular surgery who at this time recommend no need for anticoagulation  Sleep disturbance: Has done well with Ambien  Constipation: Worsening with opioids, although patient was having issues prior to hospitalization. Some of this may be from dehydration from poor by mouth intake. Continue current bowel regimen with plans to expand with goal of one bowel movement every 24 hours  Code Status: Full code   Family Communication: Spoke with wife and son on 7/2  Disposition Plan: Hopefully in next day or 2, pathology pending   Consultants:  Interventional radiology    Oncology-they will plan to see him this afternoon  Procedures:  Status post liver biopsy done 7/3: Path pending  Antimicrobials:  None   DVT prophylaxis: SCDs   Objective: Filed Vitals:   03/31/16 0910 03/31/16 0925 03/31/16 0929 03/31/16 0935  BP: 178/92 166/83 167/102 162/94  Pulse: 62 71 68 77  Temp:      TempSrc:      Resp: 20 21 13 17   Height:      Weight:      SpO2:  100% 100% 100%    Intake/Output Summary (Last 24 hours) at 03/31/16 1216 Last data filed at 03/31/16 0606  Gross per 24 hour  Intake    360 ml  Output    700 ml  Net   -340 ml   Filed Weights   03/28/16 2128  Weight: 53.978 kg (119 lb)    Exam:   General:  Alert and oriented 3, no acute distress   Cardiovascular: Regular rate and rhythm, S1-S2   Respiratory: Clear to auscultation bilaterally   Abdomen: Non- tenderness, otherwise positive bowel sounds, nondistended, soft  Musculoskeletal: No clubbing or cyanosis or edema   Skin: No skin breaks, tears or lesions  Psychiatry: Patient is appropriate, no evidence of psychoses    Data Reviewed: CBC:  Recent Labs Lab 03/29/16 0426  WBC 7.8  HGB 13.8  HCT 40.8  MCV 87.0  PLT 0000000*   Basic Metabolic Panel:  Recent Labs Lab 03/29/16 0426  NA 133*  K 3.2*  CL 100*  CO2 25  GLUCOSE 82  BUN 15  CREATININE 0.96  CALCIUM 8.7*  MG 1.8  PHOS 3.4   GFR: Estimated Creatinine Clearance: 55.5 mL/min (by C-G formula based on Cr of  0.96). Liver Function Tests:  Recent Labs Lab 03/29/16 0426  AST 37  ALT 34  ALKPHOS 122  BILITOT 0.8  PROT 6.7  ALBUMIN 4.0    Recent Labs Lab 03/29/16 0426  LIPASE 58*   No results for input(s): AMMONIA in the last 168 hours. Coagulation Profile:  Recent Labs Lab 03/29/16 0426  INR 1.27   Cardiac Enzymes: No results for input(s): CKTOTAL, CKMB, CKMBINDEX, TROPONINI in the last 168 hours. BNP (last 3 results) No results for input(s): PROBNP in the last 8760  hours. HbA1C: No results for input(s): HGBA1C in the last 72 hours. CBG: No results for input(s): GLUCAP in the last 168 hours. Lipid Profile: No results for input(s): CHOL, HDL, LDLCALC, TRIG, CHOLHDL, LDLDIRECT in the last 72 hours. Thyroid Function Tests:  Recent Labs  03/29/16 0426  TSH 2.981   Anemia Panel: No results for input(s): VITAMINB12, FOLATE, FERRITIN, TIBC, IRON, RETICCTPCT in the last 72 hours. Urine analysis: No results found for: COLORURINE, APPEARANCEUR, LABSPEC, PHURINE, GLUCOSEU, HGBUR, BILIRUBINUR, KETONESUR, PROTEINUR, UROBILINOGEN, NITRITE, LEUKOCYTESUR Sepsis Labs: @LABRCNTIP (procalcitonin:4,lacticidven:4)  )No results found for this or any previous visit (from the past 240 hour(s)).    Studies: US Biopsy  03/31/2016  INDICATION: 70 year old male within imaging diagnosis of metastatic pancreatic carcinoma. EXAM: ULTRASOUND BIOPSY CORE LIVER MEDICATIONS: None. ANESTHESIA/SEDATION: Moderate (conscious) sedation was employed during this procedure. A total of Versed 4.0 mg and Fentanyl 100 mcg was administered intravenously. Moderate Sedation Time: 14 minutes. The patient's level of consciousness and vital signs were monitored continuously by radiology nursing throughout the procedure under my direct supervision. FLUOROSCOPY TIME:  None COMPLICATIONS: None PROCEDURE: Informed written consent was obtained from the patient after a thorough discussion of the procedural risks, benefits and alternatives. All questions were addressed. Maximal Sterile Barrier Technique was utilized including caps, mask, sterile gowns, sterile gloves, sterile drape, hand hygiene and skin antiseptic. A timeout was performed prior to the initiation of the procedure. Ultrasound survey of the abdomen was performed with images stored and sent to PACs. Once the turgor was determined, the patient is prepped and draped in usual sterile fashion using chlorhexidine prep. The skin and subcutaneous tissues  were generously infiltrated 1% lidocaine for local anesthesia. A small stab incision was made with 11 blade scalpel. Using ultrasound guidance, needle was advanced in the hypoechoic lesion of the right liver lobe, segment 6, with 4 separate 18 gauge core specimen retrieved and placed in solution. Gel-Foam pledgets were infused.  Final image was recorded. Patient tolerated the procedure well and remained hemodynamically stable throughout. No complications were encountered and no significant blood loss encountered. IMPRESSION: Status post ultrasound-guided biopsy of right liver lobe mass. Tissue specimen sent to pathology complete histopathologic analysis. Signed, Dulcy Fanny. Earleen Newport, DO [facility] Electronically Signed   By: Corrie Mckusick D.O.   On: 03/31/2016 10:42    Scheduled Meds: . antiseptic oral rinse  7 mL Mouth Rinse BID  . dorzolamide  1 drop Both Eyes TID  . lisinopril  20 mg Oral Daily  . senna  1 tablet Oral BID  . zolpidem  5 mg Oral QHS    Continuous Infusions:    LOS: 3 days   Time spent: 15 minutes  [name], MD [facility] Pager [phone]  If 7PM-7AM, please contact night-coverage [email or web address] Password [email or web address] 03/31/2016, 12:16 PM

## 2016-04-01 DIAGNOSIS — I714 Abdominal aortic aneurysm, without rupture, unspecified: Secondary | ICD-10-CM | POA: Diagnosis present

## 2016-04-01 DIAGNOSIS — K5909 Other constipation: Secondary | ICD-10-CM | POA: Diagnosis present

## 2016-04-01 DIAGNOSIS — C259 Malignant neoplasm of pancreas, unspecified: Principal | ICD-10-CM

## 2016-04-01 DIAGNOSIS — E440 Moderate protein-calorie malnutrition: Secondary | ICD-10-CM

## 2016-04-01 MED ORDER — ONDANSETRON HCL 4 MG/5ML PO SOLN: 4.0000 mg | Freq: Three times a day (TID) | ORAL | Status: DC | PRN

## 2016-04-01 MED ORDER — ONDANSETRON HCL 4 MG/5ML PO SOLN: 4.0000 mg | Freq: Three times a day (TID) | ORAL | Status: AC | PRN

## 2016-04-01 MED ORDER — HYDROMORPHONE HCL 2 MG PO TABS: 2.0000 mg | ORAL_TABLET | ORAL | Status: DC | PRN

## 2016-04-01 MED ORDER — ZOLPIDEM TARTRATE 5 MG PO TABS
5.0000 mg | ORAL_TABLET | Freq: Every day | ORAL | Status: AC
Start: 2016-04-01 — End: ?

## 2016-04-01 NOTE — Progress Notes (Signed)
Pt discharged home with spouse in stable condition. Discharge instructions and scripts given. Pt and spouse verbalized understanding.

## 2016-04-01 NOTE — Discharge Summary (Signed)
Discharge Summary  Ricky Gay UTM:[id number] DOB: 10-29-1945  PCP: No primary care provider on file.  Admit date: 03/28/2016 Discharge date: 04/01/2016  Time spent: 25 minutes  Recommendations for Outpatient Follow-up:  1. New medication: Dilaudid 2 mg by mouth every 4 hours when necessary for pain 2. New medication: Zofran 4 mg solution every 8 hours when necessary for nausea 3. New medication: Ambien 5 mg by mouth daily at bedtime when necessary for sleep 4. Medication change: Patient advised to discontinue Lipitor 5. Patient will follow-up with Dr. Ancil Linsey, oncology. Office will call to set up appointment 6.  [facility]ology will call patient to set up PET scan  Discharge Diagnoses:  Active Hospital Problems   Diagnosis Date Noted  . Pancreatic cancer metastasized to liver ([place]) 03/28/2016  . HTN (hypertension) 03/28/2016  . Abdominal pain, epigastric 03/28/2016  . Pancreatic mass 03/28/2016    Resolved Hospital Problems   Diagnosis Date Noted Date Resolved  No resolved problems to display.    Discharge Condition: Stable, being discharged home   Diet recommendation: Low-sodium diet   Filed Vitals:   03/31/16 2210 04/01/16 0448  BP: 144/89 131/87  Pulse: 83 60  Temp: 97.7 F (36.5 C) 97.9 F (36.6 C)  Resp: 17 17    History of present illness:  70 year old male with past mental history of hypertension and hyperlipidemia with 2 months of abdominal pain and weight loss presented to [facility] on 6/30 where CT scan revealed masses in body and tail of pancreas plus what looked to be liver metastases and also posterior thrombus in abdominal aortic aneurysm. Patient admitted for further workup and pain control.  Hospital Course:  Active Problems:   Pancreatic cancer metastasized to liver [facility]): CA 19-9 level came back significantly elevated. Interventional radiology saw patient and the patient for liver biopsy on 7/3. Postprocedure, patient met with  oncology who said findings most likely consistent with pancreatic adenocarcinoma with metastases. They recommended CT scan of chest for follow-up for staging. This was done noting several nodules in lungs, clean without metastatic. Unable to get bone scan because of holiday, radiology recommended PET scan be a better option. Radiology will call patient on 7/5 to schedule follow-up. Patient will have appointment for follow-up for confirmation of Path and any treatment options with Dr. Ancil Linsey of oncology in [place].  In regards to patient's pain, patient required around-the-clock IV narcotics. Attempted to change over to by mouth with some success. Patient be discharged on by mouth Dilaudid 2 mg every 4 hours as needed.  Abdominal aortic aneurysm with posterior thrombus: Looks to be less than acute. Discussed with vascular surgery who at this time recommend no need for anticoagulation    HTN (hypertension): Blood pressure stable. Continue home medications    Abdominal pain, epigastric: SecondaryPancreatic mass Sleep disturbance: Between pain and anxiety, patient had difficult time sleeping. Responded very well to Ambien 5 daily at bedtime. Patient given prescription upon discharge  Moderate protein calorie malnutrition: Patient met criteria in the context of chronic illness. Seen by nutrition. Started on boost 3 times a day. Given prescription to continue  Chronic constipation: Worse with opioids, although patient was having issues for her hospitalization. Patient had bowel movement on hospital day 2 following suppositories. I offered to give him a prescription for additional medications for constipation, however he declined saying that he had other medications at home   Procedures:  Status post liver biopsy done 7/3: Path pending   Consultations:  Oncology  Interventional radiology   Discharge Exam: BP 131/87 mmHg  Pulse 60  Temp(Src) 97.9 F (36.6 C) (Oral)  Resp 17  Ht 5'  8" (1.727 m)  Wt 53.978 kg (119 lb)  BMI 18.10 kg/m2  SpO2 98%  General: Alert and oriented 3, no acute distress  Cardiovascular: Regular rate and rhythm, S1-S2  Respiratory: Clear to auscultation bilaterally   Discharge Instructions You were cared for by a hospitalist during your hospital stay. If you have any questions about your discharge medications or the care you received while you were in the hospital after you are discharged, you can call the unit and asked to speak with the hospitalist on call if the hospitalist that took care of you is not available. Once you are discharged, your primary care physician will handle any further medical issues. Please note that NO REFILLS for any discharge medications will be authorized once you are discharged, as it is imperative that you return to your primary care physician (or establish a relationship with a primary care physician if you do not have one) for your aftercare needs so that they can reassess your need for medications and monitor your lab values.  Discharge Instructions    Diet - low sodium heart healthy    Complete by:  As directed      Increase activity slowly    Complete by:  As directed             Medication List    STOP taking these medications        atorvastatin 10 MG tablet  Commonly known as:  LIPITOR      TAKE these medications        dicyclomine 10 MG capsule  Commonly known as:  BENTYL  Take 10 mg by mouth 3 (three) times daily as needed. For abdominal cramping     dorzolamide 2 % ophthalmic solution  Commonly known as:  TRUSOPT  Place 1 drop into both eyes 3 (three) times daily.     HYDROmorphone 2 MG tablet  Commonly known as:  DILAUDID  Take 1 tablet (2 mg total) by mouth every 4 (four) hours as needed for severe pain.     lisinopril-hydrochlorothiazide 20-12.5 MG tablet  Commonly known as:  PRINZIDE,ZESTORETIC  Take 1 tablet by mouth daily.     ondansetron 4 MG/5ML solution  Commonly known as:   ZOFRAN  Take 5 mLs (4 mg total) by mouth every 8 (eight) hours as needed for nausea or vomiting.     zolpidem 5 MG tablet  Commonly known as:  AMBIEN  Take 1 tablet (5 mg total) by mouth at bedtime.       Allergies  Allergen Reactions  . Bactrim [Sulfamethoxazole-Trimethoprim] Itching       Follow-up Information    Follow up with Molli Hazard, MD.   Specialties:  Hematology and Oncology, Oncology   Why:  Office will call you to set up follow up appointment   Contact information:   [place] [address] [phone]       Follow up with [facility].   Why:  Will call you on Wednesday to set up PET scan       The results of significant diagnostics from this hospitalization (including imaging, microbiology, ancillary and laboratory) are listed below for reference.    Significant Diagnostic Studies: Ct Chest W Contrast  03/31/2016  CLINICAL DATA:  Suspected carcinoma of pancreas metastatic to liver. Pancreatic mass and  liver lesions. Evaluate for intrathoracic metastasis, staging. EXAM: CT CHEST WITH CONTRAST TECHNIQUE: Multidetector CT imaging of the chest was performed during intravenous contrast administration. CONTRAST:  58m ISOVUE-300 IOPAMIDOL (ISOVUE-300) INJECTION 61% COMPARISON:  No prior chest exams.  Abdominal CT 03/28/2016. FINDINGS: Mediastinum/Lymph Nodes: Borderline subcarinal node measures 11 mm short axis. No additional enlarged hilar or mediastinal lymph nodes. No mediastinal mass or pericardial fluid. Atherosclerosis of normal caliber thoracic aorta. Coronary artery calcifications are seen. Lungs/Pleura: Right lower lobe 6 mm nodule image 100 series 5. 6 mm nodule right upper lobe image 40 series 5. These are concerning for metastatic disease. There are tiny subpleural nodules in both upper lobes, majority less than 2 mm better nonspecific. Linear densities in both lower lobes and lingular consistent with atelectasis. No pleural  effusion. No confluent airspace disease. Upper abdomen: Multiple hepatic lesions. Air within the larger lesion, sequela of percutaneous biopsy earlier today. No evidence of perihepatic hemorrhage. Musculoskeletal: Small sclerotic focus within T11 anteriorly. Small sclerotic focus in the distal left clavicle. There is cortical thickening evolving multiple vertebral bodies, nonspecific. No evidence of destructive lytic lesion. IMPRESSION: 1. Right lower and right upper lobe pulmonary nodules both measuring 6 mm, suspicious for metastatic disease. Multiple tiny subpleural nodules, upper lobe predominant, are nonspecific, may be inflammatory or small intrapulmonary lymph nodes, with metastatic disease not entirely excluded. 2. Prominent subcarinal node measures 11 mm is nonspecific, otherwise no nodal metastasis. 3. Scattered small sclerotic foci, within T11 and the left clavicle. Similar sclerotic foci are seen within the abdominal pelvic osseous structures. Electronically Signed   By: MJeb LeveringM.D.   On: 03/31/2016 22:26   [place]  03/31/2016  INDICATION: 70year old male within imaging diagnosis of metastatic pancreatic carcinoma. EXAM: ULTRASOUND BIOPSY CORE LIVER MEDICATIONS: None. ANESTHESIA/SEDATION: Moderate (conscious) sedation was employed during this procedure. A total of Versed 4.0 mg and Fentanyl 100 mcg was administered intravenously. Moderate Sedation Time: 14 minutes. The patient's level of consciousness and vital signs were monitored continuously by radiology nursing throughout the procedure under my direct supervision. FLUOROSCOPY TIME:  None COMPLICATIONS: None PROCEDURE: Informed written consent was obtained from the patient after a thorough discussion of the procedural risks, benefits and alternatives. All questions were addressed. Maximal Sterile Barrier Technique was utilized including caps, mask, sterile gowns, sterile gloves, sterile drape, hand hygiene and skin antiseptic. A timeout  was performed prior to the initiation of the procedure. Ultrasound survey of the abdomen was performed with images stored and sent to PACs. Once the turgor was determined, the patient is prepped and draped in usual sterile fashion using chlorhexidine prep. The skin and subcutaneous tissues were generously infiltrated 1% lidocaine for local anesthesia. A small stab incision was made with 11 blade scalpel. Using ultrasound guidance, needle was advanced in the hypoechoic lesion of the right liver lobe, segment 6, with 4 separate 18 gauge core specimen retrieved and placed in solution. Gel-Foam pledgets were infused.  Final image was recorded. Patient tolerated the procedure well and remained hemodynamically stable throughout. No complications were encountered and no significant blood loss encountered. IMPRESSION: Status post ultrasound-guided biopsy of right liver lobe mass. Tissue specimen sent to pathology complete histopathologic analysis. Signed, JDulcy Fanny WEarleen Newport DO [facility] Electronically Signed   By: JCorrie MckusickD.O.   On: 03/31/2016 10:42   Ct Outside Films Body  03/31/2016  CLINICAL DATA:  This exam is stored here for comparison purposes only and was performed at an outside  facility.   Please contact the originating institution for any associated interpretation or report.    Microbiology: No results found for this or any previous visit (from the past 240 hour(s)).   Labs: Basic Metabolic Panel:  Recent Labs Lab 03/29/16 0426  NA 133*  K 3.2*  CL 100*  CO2 25  GLUCOSE 82  BUN 15  CREATININE 0.96  CALCIUM 8.7*  MG 1.8  PHOS 3.4   Liver Function Tests:  Recent Labs Lab 03/29/16 0426  AST 37  ALT 34  ALKPHOS 122  BILITOT 0.8  PROT 6.7  ALBUMIN 4.0    Recent Labs Lab 03/29/16 0426  LIPASE 58*   No results for input(s): AMMONIA in the last 168 hours. CBC:  Recent Labs Lab 03/29/16 0426  WBC 7.8  HGB 13.8   HCT 40.8  MCV 87.0  PLT 138*   Cardiac Enzymes: No results for input(s): CKTOTAL, CKMB, CKMBINDEX, TROPONINI in the last 168 hours. BNP: BNP (last 3 results) No results for input(s): BNP in the last 8760 hours.  ProBNP (last 3 results) No results for input(s): PROBNP in the last 8760 hours.  CBG: No results for input(s): GLUCAP in the last 168 hours.     Signed:  [name], MD [facility] 04/01/2016, 1:35 PM

## 2016-04-03 ENCOUNTER — Other Ambulatory Visit: Payer: Self-pay | Admitting: General Surgery

## 2016-04-03 ENCOUNTER — Encounter (HOSPITAL_COMMUNITY): Payer: Medicare HMO | Attending: Hematology & Oncology | Admitting: Hematology & Oncology

## 2016-04-03 ENCOUNTER — Encounter (HOSPITAL_COMMUNITY): Payer: Self-pay | Admitting: Hematology & Oncology

## 2016-04-03 VITALS — BP 150/78 | HR 66 | Temp 97.7°F | Resp 18 | Wt 190.2 lb

## 2016-04-03 DIAGNOSIS — Z79899 Other long term (current) drug therapy: Secondary | ICD-10-CM | POA: Insufficient documentation

## 2016-04-03 DIAGNOSIS — C259 Malignant neoplasm of pancreas, unspecified: Secondary | ICD-10-CM | POA: Insufficient documentation

## 2016-04-03 DIAGNOSIS — R109 Unspecified abdominal pain: Secondary | ICD-10-CM | POA: Diagnosis not present

## 2016-04-03 DIAGNOSIS — Z9889 Other specified postprocedural states: Secondary | ICD-10-CM | POA: Insufficient documentation

## 2016-04-03 DIAGNOSIS — I10 Essential (primary) hypertension: Secondary | ICD-10-CM | POA: Insufficient documentation

## 2016-04-03 DIAGNOSIS — G893 Neoplasm related pain (acute) (chronic): Secondary | ICD-10-CM

## 2016-04-03 DIAGNOSIS — F418 Other specified anxiety disorders: Secondary | ICD-10-CM

## 2016-04-03 DIAGNOSIS — C7951 Secondary malignant neoplasm of bone: Secondary | ICD-10-CM

## 2016-04-03 DIAGNOSIS — I714 Abdominal aortic aneurysm, without rupture: Secondary | ICD-10-CM

## 2016-04-03 DIAGNOSIS — K5909 Other constipation: Secondary | ICD-10-CM | POA: Insufficient documentation

## 2016-04-03 DIAGNOSIS — C787 Secondary malignant neoplasm of liver and intrahepatic bile duct: Secondary | ICD-10-CM | POA: Insufficient documentation

## 2016-04-03 DIAGNOSIS — K5900 Constipation, unspecified: Secondary | ICD-10-CM

## 2016-04-03 DIAGNOSIS — R5383 Other fatigue: Secondary | ICD-10-CM

## 2016-04-03 DIAGNOSIS — F411 Generalized anxiety disorder: Secondary | ICD-10-CM

## 2016-04-03 DIAGNOSIS — H409 Unspecified glaucoma: Secondary | ICD-10-CM | POA: Insufficient documentation

## 2016-04-03 DIAGNOSIS — E46 Unspecified protein-calorie malnutrition: Secondary | ICD-10-CM | POA: Insufficient documentation

## 2016-04-03 MED ORDER — PROCHLORPERAZINE MALEATE 10 MG PO TABS: 10.0000 mg | ORAL_TABLET | Freq: Four times a day (QID) | ORAL | Status: AC | PRN

## 2016-04-03 MED ORDER — FENTANYL 12 MCG/HR TD PT72: 12.0000 ug | MEDICATED_PATCH | TRANSDERMAL | Status: DC

## 2016-04-03 MED ORDER — HYDROMORPHONE HCL 4 MG PO TABS: 4.0000 mg | ORAL_TABLET | ORAL | Status: DC | PRN

## 2016-04-03 MED ORDER — LIDOCAINE-PRILOCAINE 2.5-2.5 % EX CREA: TOPICAL_CREAM | CUTANEOUS | Status: AC

## 2016-04-03 NOTE — Patient Instructions (Signed)
[place] at [facility] Discharge Instructions  RECOMMENDATIONS MADE BY THE CONSULTANT AND ANY TEST RESULTS WILL BE SENT TO YOUR REFERRING PHYSICIAN.  Fentanyl patch 12.51mcg. Start with 1 patch and you may remove it after 24 hours and put 2 patches on. Give this 24 hours to see an improvement. If no improvement take the 2 patches off and apply 3 patches.  Constipation sheet given   You will get a port @ [facility]  Cancelling PET scan  Chemo teaching on Wednesday July 12 @ 1130 in the [place] here @ [facility]. Please arrive around 1125 to the Main Entrance Information Desk and let the receptionist know that you are here for chemo teaching. She will direct you in the right direction to the [place] Room.    Call [phone] (Amy our scheduler) for any questions/problems/concerns.  Return for chemo next Friday (after chemo teaching and port placement have been completed)   Thank you for choosing [place] at [facility] to provide your oncology and hematology care.  To afford each patient quality time with our provider, please arrive at least 15 minutes before your scheduled appointment time.   Beginning January 23rd 2017 lab work for the [facility] will be done in the  Main lab at [facility] on 1st floor. If you have a lab appointment with the [facility] please come in thru the  Main Entrance and check in at the main information desk  You need to re-schedule your appointment should you arrive 10 or more minutes late.  We strive to give you quality time with our providers, and arriving late affects you and other patients whose appointments are after yours.  Also, if you no show three or more times for appointments you may be dismissed from the clinic at the providers discretion.     Again, thank you for choosing [facility].  Our hope is that these requests will decrease the amount of time that you wait  before being seen by our physicians.       _____________________________________________________________  Should you have questions after your visit to [facility], please contact our office at [phone] between the hours of 8:30 a.m. and 4:30 p.m.  Voicemails left after 4:30 p.m. will not be returned until the following business day.  For prescription refill requests, have your pharmacy contact our office.         Resources For Cancer Patients and their Caregivers ? American Cancer Society: Can assist with transportation, wigs, general needs, runs Look Good Feel Better.        4308839377 ? Cancer Care: Provides financial assistance, online support groups, medication/co-pay assistance.  1-800-813-HOPE 808-521-2292) ? [place] Assists [place] cancer patients and their families through emotional , educational and financial support.  (512) 580-8557 ? [place] DSS Where to apply for food stamps, Medicaid and utility assistance. 8107959761 ? [facility]: Transportation to medical appointments. 605-029-7780 ? Social Security Administration: May apply for disability if have a Stage IV cancer. 737-501-6140 248-369-6955 ? [employer], Disability and Transit Services: Assists with nutrition, care and transit needs. [facility] Support Programs: @10RELATIVEDAYS @ > Cancer Support Group  2nd Tuesday of the month 1pm-2pm, Journey Room  > Creative Journey  3rd Tuesday of the month 1130am-1pm, Journey Room  > Look Good Feel Better  1st Wednesday of the month 10am-12 noon, Journey Room (Call American Cancer  Society to register [phone])

## 2016-04-03 NOTE — Progress Notes (Signed)
[name]  CONSULT NOTE  No care team member to display  CHIEF COMPLAINTS/PURPOSE OF CONSULTATION:  Stage IV adenocarcinoma of pancreas Fatigue Weight loss Abdominal pain    Pancreatic cancer metastasized to liver ([place])   03/29/2016 Tumor Marker    CA [id number]     03/31/2016 Imaging    CT chest RL and RUL pulmonary nodules suspicious, prominent subcarinal node, scattered small sclerotic foci within T11 and L clavicle also seen within the abdominal pelvic osseous structures     03/31/2016 Initial Biopsy    US biopsy of R liver lobe mass     03/31/2016 Pathology Results    metastatic adenocarcinoma CK7 and CK20 positive c/w pancreaticobiliary origin      HISTORY OF PRESENTING ILLNESS:  Ricky Gay 70 y.o. male is here because of stage IV adenocarcinoma of the pancreas. He is accompanied by his wife.  Upon entering the room, Ricky Gay is walking around, including leaning down to the floor. He says it's uncomfortable to sit and more comfortable to stand. He just took some pain medicine while he ate lunch, but remarks that it isn't working, and that this is the first time he's eaten while taking it. He is currently using dilaudid for pain management. He notes that he was given morphine to start with, which made him nauseous.  He denies any trouble controlling his pain at night. He says he sleeps all night unless he has to wake up to go to the bathroom.   When asked to describe the onset of his current symptoms, he begins with: "I was fine through the month of May." He says he's playing golf, doing whatever he wants to do; then he started getting pain. He told himself it would get better. He was past due for a colonoscopy, and finally got worked in on the 27th of June. The colonoscopy came back clean. He notes that while his principle pain is his lower abdomen; at the time, his pain was all around his midsection. His GP x-rayed him at the beginning of June, and declared he  was constipated. Ricky Gay ended up in the ER; he knew a PA there, which comforted him. 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 Ricky Gay checked in to [facility] on Friday night, June 30th, and came home on July 4th. He's having a PET scan on July 12th.  His wife notes that this is the first time he's ever spent the night in the hospital, after over thirty years of marriage. She notes that he's having a horrible time with constipation and has had to use a suppository. He'll go 3-4 days constipated, without going to the bathroom, taking stuff to help him go and still not going. In addition to his constipation, his wife is concerned about his pain management. She notes "he can function fairly well while on the pain medicine," but she's worried that he's going to run out. He notes "I don't want to get hooked on this stuff." He says "I want to get well quick, please."  The patient notes that he has a slow heart rate.  They have an 64 month old grandson who is into everything.  His son (their only child) is an attorney and wants to have the patient's information shared with him.   "History of Present Illness from Dr. Burr Medico 03/31/2016: Ricky Gay is a  69-ear-old gentleman with past medical history of hypertension, glaucoma, hyperlipidemia, otherwise healthy and fit, presented with intermittent and a worsening epigastric pain 4 months. His pain gets worse after meals, and he has been eating less, he lost about 15 pounds in the past 2 months. He has moderate fatigue, much less active lately. But able to function at home. He initially was seen by primary care physician, underwent EGD which was negative. He presented to [facility], CT scan of abdomen and pelvis showed a large pelvic lytic lesion with multiple liver metastasis. He was transferred to [facility] for further workup. He underwent ultrasound-guided liver  biopsy this morning, tolerated the procedure well."  FROM ADMISSION 03/28/2016   MEDICAL HISTORY:  Past Medical History  Diagnosis Date  . Hypertension   . AAA (abdominal aortic aneurysm) ([place])   . Chronic constipation   . Pancreatic cancer ([place])   . Malnutrition ([place])   . Glaucoma     SURGICAL HISTORY: Past Surgical History  Procedure Laterality Date  . Eye surgery      SOCIAL HISTORY: Social History   Social History  . Marital Status: Married    Spouse Name: N/A  . Number of Children: N/A  . Years of Education: N/A   Occupational History  . Not on file.   Social History Main Topics  . Smoking status: Former Research scientist (life sciences)  . Smokeless tobacco: Not on file  . Alcohol Use: Yes     Comment: once a week  . Drug Use: No  . Sexual Activity: Not on file   Other Topics Concern  . Not on file   Social History Narrative   Married 30+ years She was a Radio producer; he was in Press photographer. 1 son, only son. 1 grandson.  FAMILY HISTORY: Family History  Problem Relation Age of Onset  . Dementia Mother   . CAD Father   . Stroke Father   . Cancer Other   . Diabetes Neg Hx     ALLERGIES:  is allergic to bactrim.  MEDICATIONS:  Current Outpatient Prescriptions  Medication Sig Dispense Refill  . dorzolamide (TRUSOPT) 2 % ophthalmic solution Place 1 drop into both eyes 3 (three) times daily.     [place] Kitchen HYDROmorphone (DILAUDID) 4 MG tablet Take 1 tablet (4 mg total) by mouth every 4 (four) hours as needed for severe pain. 90 tablet 0  . lisinopril-hydrochlorothiazide (PRINZIDE,ZESTORETIC) 20-12.5 MG tablet Take 1 tablet by mouth daily.  1  . ondansetron (ZOFRAN) 4 MG/5ML solution Take 5 mLs (4 mg total) by mouth every 8 (eight) hours as needed for nausea or vomiting. 50 mL 2  . zolpidem (AMBIEN) 5 MG tablet Take 1 tablet (5 mg total) by mouth at bedtime. 30 tablet 0  . dicyclomine (BENTYL) 10 MG capsule Take 10 mg by mouth 3 (three) times daily as needed. Reported on 04/03/2016  1  .  fentaNYL (DURAGESIC - DOSED MCG/HR) 12 MCG/HR Place 1 patch (12.5 mcg total) onto the skin every 3 (three) days. 10 patch 0   No current facility-administered medications for this visit.    Review of Systems  Constitutional: Positive for malaise/fatigue and weight loss. Negative for chills, diaphoresis and fever.  HENT: Negative.  Negative for congestion, ear discharge, ear pain, hearing loss, nosebleeds, sore throat and tinnitus.   Eyes: Negative.  Negative for blurred vision, double vision, photophobia, pain, discharge and redness.  Respiratory: Negative.  Negative for cough, hemoptysis, sputum production, shortness of breath, wheezing and stridor.   Cardiovascular:  Negative.  Negative for chest pain, palpitations, orthopnea, claudication, leg swelling and PND.  Gastrointestinal: Positive for abdominal pain, constipation and nausea. Negative for blood in stool, diarrhea, heartburn, melena and vomiting.       Due to pain medicine and disease.  Genitourinary: Negative.  Negative for dysuria, flank pain, frequency, hematuria and urgency.  Musculoskeletal: Negative.  Negative for back pain, falls, joint pain, myalgias and neck pain.  Skin: Negative.  Negative for itching and rash.  Neurological: Positive for weakness. Negative for dizziness, tingling, tremors, sensory change, speech change, focal weakness, seizures, loss of consciousness and headaches.  Endo/Heme/Allergies: Negative for environmental allergies and polydipsia. Does not bruise/bleed easily.  Psychiatric/Behavioral: Negative for depression, hallucinations, memory loss, substance abuse and suicidal ideas. The patient is nervous/anxious. The patient does not have insomnia.   All other systems reviewed and are negative.  14 point ROS was done and is otherwise as detailed above or in HPI    PHYSICAL EXAMINATION: ECOG PERFORMANCE STATUS: 1 - Symptomatic but completely ambulatory  Filed Vitals:   04/03/16 1309  BP: 150/78  Pulse:  66  Temp: 97.7 F (36.5 C)  Resp: 18   Filed Weights   04/03/16 1309  Weight: 190 lb 3.2 oz (86.274 kg)     Physical Exam  Constitutional: He is oriented to person, place, and time and well-developed, well-nourished, and in no distress.  HENT:  Head: Normocephalic and atraumatic.  Nose: Nose normal.  Mouth/Throat: Oropharynx is clear and moist. No oropharyngeal exudate.  Eyes: Conjunctivae and EOM are normal. Pupils are equal, round, and reactive to light. Right eye exhibits no discharge. Left eye exhibits no discharge. No scleral icterus.  Neck: Normal range of motion. Neck supple. No tracheal deviation present. No thyromegaly present.  Cardiovascular: Normal rate, regular rhythm, normal heart sounds and intact distal pulses.  Exam reveals no gallop and no friction rub.   No murmur heard. Pulmonary/Chest: Effort normal and breath sounds normal. He has no wheezes. He has no rales.  Abdominal: Soft. Bowel sounds are normal. He exhibits no distension and no mass. There is tenderness. There is no rebound and no guarding.  Pain in the upper abdominal quadrant with palpation. Hypoactive BS throughout. No guarding  Musculoskeletal: Normal range of motion. He exhibits no edema.  Lymphadenopathy:    He has no cervical adenopathy.  Neurological: He is alert and oriented to person, place, and time. He has normal reflexes. No cranial nerve deficit. Gait normal. Coordination normal.  Skin: Skin is warm and dry. No rash noted.  Psychiatric: Mood, memory, affect and judgment normal.  Nursing note and vitals reviewed.   LABORATORY DATA:  I have reviewed the data as listed Lab Results  Component Value Date   WBC 7.8 03/29/2016   HGB 13.8 03/29/2016   HCT 40.8 03/29/2016   MCV 87.0 03/29/2016   PLT 138* 03/29/2016   CMP     Component Value Date/Time   NA 133* 03/29/2016 0426   K 3.2* 03/29/2016 0426   CL 100* 03/29/2016 0426   CO2 25 03/29/2016 0426   GLUCOSE 82 03/29/2016 0426    BUN 15 03/29/2016 0426   CREATININE 0.96 03/29/2016 0426   CALCIUM 8.7* 03/29/2016 0426   PROT 6.7 03/29/2016 0426   ALBUMIN 4.0 03/29/2016 0426   AST 37 03/29/2016 0426   ALT 34 03/29/2016 0426   ALKPHOS 122 03/29/2016 0426   BILITOT 0.8 03/29/2016 0426   GFRNONAA >60 03/29/2016 0426   GFRAA >60 03/29/2016 [id number]  Results for Ricky Gay, Ricky Gay (MRN [id number])   Ref. Range 03/29/2016 04:26 03/30/2016 10:56  CA 19-9 Latest Ref Range: 0 - 35 U/mL 11,274 (H) 10,139 (H)   PATHOLOGY:       RADIOGRAPHIC STUDIES: I have personally reviewed the radiological images as listed and agreed with the findings in the report. Ct Chest W Contrast  03/31/2016  CLINICAL DATA:  Suspected carcinoma of pancreas metastatic to liver. Pancreatic mass and liver lesions. Evaluate for intrathoracic metastasis, staging. EXAM: CT CHEST WITH CONTRAST TECHNIQUE: Multidetector CT imaging of the chest was performed during intravenous contrast administration. CONTRAST:  56mL ISOVUE-300 IOPAMIDOL (ISOVUE-300) INJECTION 61% COMPARISON:  No prior chest exams.  Abdominal CT 03/28/2016. FINDINGS: Mediastinum/Lymph Nodes: Borderline subcarinal node measures 11 mm short axis. No additional enlarged hilar or mediastinal lymph nodes. No mediastinal mass or pericardial fluid. Atherosclerosis of normal caliber thoracic aorta. Coronary artery calcifications are seen. Lungs/Pleura: Right lower lobe 6 mm nodule image 100 series 5. 6 mm nodule right upper lobe image 40 series 5. These are concerning for metastatic disease. There are tiny subpleural nodules in both upper lobes, majority less than 2 mm better nonspecific. Linear densities in both lower lobes and lingular consistent with atelectasis. No pleural effusion. No confluent airspace disease. Upper abdomen: Multiple hepatic lesions. Air within the larger lesion, sequela of percutaneous biopsy earlier today. No evidence of perihepatic hemorrhage. Musculoskeletal: Small sclerotic focus within T11  anteriorly. Small sclerotic focus in the distal left clavicle. There is cortical thickening evolving multiple vertebral bodies, nonspecific. No evidence of destructive lytic lesion. IMPRESSION: 1. Right lower and right upper lobe pulmonary nodules both measuring 6 mm, suspicious for metastatic disease. Multiple tiny subpleural nodules, upper lobe predominant, are nonspecific, may be inflammatory or small intrapulmonary lymph nodes, with metastatic disease not entirely excluded. 2. Prominent subcarinal node measures 11 mm is nonspecific, otherwise no nodal metastasis. 3. Scattered small sclerotic foci, within T11 and the left clavicle. Similar sclerotic foci are seen within the abdominal pelvic osseous structures. Electronically Signed   By: Jeb Levering M.D.   On: 03/31/2016 22:26   US Biopsy  03/31/2016  INDICATION: 70 year old male within imaging diagnosis of metastatic pancreatic carcinoma. EXAM: ULTRASOUND BIOPSY CORE LIVER MEDICATIONS: None. ANESTHESIA/SEDATION: Moderate (conscious) sedation was employed during this procedure. A total of Versed 4.0 mg and Fentanyl 100 mcg was administered intravenously. Moderate Sedation Time: 14 minutes. The patient's level of consciousness and vital signs were monitored continuously by radiology nursing throughout the procedure under my direct supervision. FLUOROSCOPY TIME:  None COMPLICATIONS: None PROCEDURE: Informed written consent was obtained from the patient after a thorough discussion of the procedural risks, benefits and alternatives. All questions were addressed. Maximal Sterile Barrier Technique was utilized including caps, mask, sterile gowns, sterile gloves, sterile drape, hand hygiene and skin antiseptic. A timeout was performed prior to the initiation of the procedure. Ultrasound survey of the abdomen was performed with images stored and sent to PACs. Once the turgor was determined, the patient is prepped and draped in usual sterile fashion using  chlorhexidine prep. The skin and subcutaneous tissues were generously infiltrated 1% lidocaine for local anesthesia. A small stab incision was made with 11 blade scalpel. Using ultrasound guidance, needle was advanced in the hypoechoic lesion of the right liver lobe, segment 6, with 4 separate 18 gauge core specimen retrieved and placed in solution. Gel-Foam pledgets were infused.  Final image was recorded. Patient tolerated the procedure well and remained hemodynamically stable throughout. No complications were encountered and  no significant blood loss encountered. IMPRESSION: Status post ultrasound-guided biopsy of right liver lobe mass. Tissue specimen sent to pathology complete histopathologic analysis. Signed, Dulcy Fanny. Earleen Newport, DO [facility] Radiology Electronically Signed   By: Corrie Mckusick D.O.   On: 03/31/2016 10:42   Ct Outside Films Body  03/31/2016  CLINICAL DATA:  This exam is stored here for comparison purposes only and was performed at an outside facility.   Please contact the originating institution for any associated interpretation or report.    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 Extensive liver metastases are reported. Copies of his films will be needed for comparison of restaging studies moving forward.   We discussed pancreatic cancer in detail. We discussed that he has stage IV disease and that it is not curable. He has a hard time understanding why he has felt well up until recently and then finding out it is so advanced. He notes he had little warning.   We discussed that systemic chemotherapy provides benefit to patients with advanced pancreatic cancer,  improving disease-related symptoms and survival when compared with best supportive care alone. He should understand that chemotherapy is palliative and not curative. Patients with metastatic pancreatic cancer should be offered aggressive treatment of pain and other symptoms related to the cancer  We discussed the fact that there are two major choices of therapy for pancreatic cancer, according to the standard of care established by national guidelines. We discussed FOLFIRINOX, I do not feel he will do well with this regimen. We spent time discussing this in detail. We discussed other alternatives such as gemzar/abraxane.   We discussed pain control. We also discussed how the long-acting and short-acting pain medicines will work together.  We will start him on some long-acting pain medicine. He will have a pain regimen and a bowel regimen when he leaves today. He will also be set up for a port-a-cath, to be inserted before his July 12th PET scan. They would like to know whether he can have his port-a-cath inserted on the same day of his PET scan, or whether they can cancel his PET scan entirely. They would like to cancel his PET scan entirely. I will get his port placement set up at [facility]. I have cancelled his PET.  I will give him reading information; pain patch; give him dilaudid refills; give him a bowel regimen sheet; an appointment for chemotherapy class; an appointment for port-a-cath placement; and an appointment to see [place] back.  Their son is an attorney and would like to hear the results of his father's biopsies, and what has been told to his parents during the appointment today. Their son can be reached at the phone number [phone]. His name is Willett Ozbirn.  Meds ordered this encounter  Medications  . fentaNYL (DURAGESIC - DOSED MCG/HR) 12 MCG/HR    Sig: Place 1 patch (12.5 mcg total) onto the skin every 3 (three) days.    Dispense:  10 patch    Refill:  0  . HYDROmorphone  (DILAUDID) 4 MG tablet    Sig: Take 1 tablet (4 mg total) by mouth every 4 (four) hours as needed for severe pain.    Dispense:  90 tablet    Refill:  0  From up to date: Superiority for the combination of nabpaclitaxel (125 mg/m2) followed by gemcitabine (1000 mg/m2, each given on days 1, 8, and 15 every 28 days) over gemcitabine alone (1000 mg/m2 weekly for seven weeks, then on days 1, 8, and 15 every four weeks) was shown in the multi-national MPACT trial of 861 patients with previously untreated metastatic pancreatic adenocarcinoma. Combined therapy was associated with a significantly higher objective response rate (23 versus 7 percent), and a significantly longer median overall survival (8.5 versus 6.7 months) and PFS (5.5 versus 3.7 months). Grade 3 or 4 adverse events that were seen more often with combined therapy included neutropenia (38 versus 27 percent), febrile neutropenia (3 versus 1 percent), fatigue (17 versus 7 percent), diarrhea (6 versus 1 percent), and neuropathy (17 versus 1 percent). Longer follow-up identified some long-term (greater than three years) survivors in the nabpaclitaxel/gemcitabine arm. In September 2013, nabpaclitaxel in combination with gemcitabine was approved in the [place] for the first-line treatment of patients with metastatic adenocarcinoma of the pancreas.  A modification in the regimen has been proposed (gemcitabine 1000 mg/m2 and nabpaclitaxel 125 mg/m2 every two weeks) that, in a preliminary report presented at the 2015 ASCO Gastrointestinal Cancer Symposium, was associated with a median overall survival of 11.1 months, and less severe (grade 3 or 4) neutropenia (10 percent) and peripheral neuropathy (2 percent) than seen with the original regimen.  All questions were answered. The patient knows to call the clinic with any problems, questions or concerns.  This document serves as a record of services personally performed by Ancil Linsey, MD. It  was created on her behalf by Toni Amend, a trained medical scribe. The creation of this record is based on the scribe's personal observations and the provider's statements to them. This document has been checked and approved by the attending provider.  I have reviewed the above documentation for accuracy and completeness and I agree with the above.  This note was electronically signed.    Molli Hazard, MD  04/03/2016 2:21 PM

## 2016-04-04 ENCOUNTER — Ambulatory Visit (HOSPITAL_COMMUNITY)
Admission: RE | Admit: 2016-04-04 | Discharge: 2016-04-04 | Disposition: A | Discharge status: Home / Self Care | Payer: Medicare HMO | Source: Ambulatory Visit | Attending: Hematology & Oncology | Admitting: Hematology & Oncology

## 2016-04-04 ENCOUNTER — Other Ambulatory Visit (HOSPITAL_COMMUNITY): Payer: Self-pay | Admitting: Hematology & Oncology

## 2016-04-04 ENCOUNTER — Encounter (HOSPITAL_COMMUNITY): Payer: Self-pay

## 2016-04-04 DIAGNOSIS — Z882 Allergy status to sulfonamides status: Secondary | ICD-10-CM | POA: Insufficient documentation

## 2016-04-04 DIAGNOSIS — C787 Secondary malignant neoplasm of liver and intrahepatic bile duct: Principal | ICD-10-CM

## 2016-04-04 DIAGNOSIS — Z79899 Other long term (current) drug therapy: Secondary | ICD-10-CM | POA: Insufficient documentation

## 2016-04-04 DIAGNOSIS — C259 Malignant neoplasm of pancreas, unspecified: Secondary | ICD-10-CM

## 2016-04-04 LAB — PROTIME-INR
INR: 1.1 (ref 0.00–1.49)
PROTHROMBIN TIME: 14.4 s (ref 11.6–15.2)

## 2016-04-04 LAB — BASIC METABOLIC PANEL
ANION GAP: 10 (ref 5–15)
BUN: 13 mg/dL (ref 6–20)
CALCIUM: 9.1 mg/dL (ref 8.9–10.3)
CHLORIDE: 99 mmol/L — AB (ref 101–111)
CO2: 23 mmol/L (ref 22–32)
CREATININE: 0.85 mg/dL (ref 0.61–1.24)
GFR calc non Af Amer: >60 mL/min (ref >60)
Glucose, Bld: 91 mg/dL (ref 65–99)
Potassium: 3.9 mmol/L (ref 3.5–5.1)
SODIUM: 132 mmol/L — AB (ref 135–145)

## 2016-04-04 LAB — CBC
HCT: 39.7 % (ref 39.0–52.0)
HEMOGLOBIN: 13.8 g/dL (ref 13.0–17.0)
MCH: 29.8 pg (ref 26.0–34.0)
MCHC: 34.8 g/dL (ref 30.0–36.0)
MCV: 85.7 fL (ref 78.0–100.0)
PLATELETS: 154 10*3/uL (ref 150–400)
RBC: 4.63 MIL/uL (ref 4.22–5.81)
RDW: 13.2 % (ref 11.5–15.5)
WBC: 9.3 10*3/uL (ref 4.0–10.5)

## 2016-04-04 LAB — APTT: aPTT: 27 seconds (ref 24–37)

## 2016-04-04 MED ORDER — SODIUM CHLORIDE 0.9 % IV SOLN
INTRAVENOUS | Status: DC
  Administered 2016-04-04: 13:00:00 via INTRAVENOUS

## 2016-04-04 MED ORDER — FENTANYL CITRATE (PF) 100 MCG/2ML IJ SOLN
INTRAMUSCULAR | Status: AC
  Filled 2016-04-04: qty 4

## 2016-04-04 MED ORDER — CEFAZOLIN SODIUM-DEXTROSE 2-4 GM/100ML-% IV SOLN
2.0000 g | INTRAVENOUS | Status: AC
  Administered 2016-04-04: 2 g via INTRAVENOUS
  Filled 2016-04-04: qty 100

## 2016-04-04 MED ORDER — HEPARIN SOD (PORK) LOCK FLUSH 100 UNIT/ML IV SOLN
INTRAVENOUS | Status: AC
  Filled 2016-04-04: qty 5

## 2016-04-04 MED ORDER — HEPARIN SOD (PORK) LOCK FLUSH 100 UNIT/ML IV SOLN
INTRAVENOUS | Status: AC | PRN
  Administered 2016-04-04: 500 [IU]

## 2016-04-04 MED ORDER — MIDAZOLAM HCL 2 MG/2ML IJ SOLN
INTRAMUSCULAR | Status: AC
  Filled 2016-04-04: qty 6

## 2016-04-04 MED ORDER — FENTANYL CITRATE (PF) 100 MCG/2ML IJ SOLN
INTRAMUSCULAR | Status: AC | PRN
  Administered 2016-04-04: 50 ug via INTRAVENOUS

## 2016-04-04 MED ORDER — MIDAZOLAM HCL 2 MG/2ML IJ SOLN
INTRAMUSCULAR | Status: AC | PRN
  Administered 2016-04-04: 2 mg via INTRAVENOUS

## 2016-04-04 NOTE — Progress Notes (Signed)
Patient ID: Ricky Gay, male   DOB: 03-16-1946, 70 y.o.   MRN: [id number]    Referring Physician(s): [name]  Supervising Physician: Corrie Mckusick  Patient Status:  Outpatient  Chief Complaint:  Pancreatic cancer  Subjective:  Pt familiar to IR service from recent liver lesion biopsy on 03/31/16. He has a history of metastatic pancreatic cancer and presents today for Port-A-Cath placement for chemotherapy. Currently denies fever, headache, chest pain, dyspnea, cough, nausea, vomiting or abnormal bleeding. He does have abdominal / back pain, decreased appetite, weight loss and occasional night sweats.  Allergies: Bactrim  Medications: Prior to Admission medications   Medication Sig Start Date End Date Taking? Authorizing Provider  dorzolamide (TRUSOPT) 2 % ophthalmic solution Place 1 drop into both eyes 3 (three) times daily.  03/27/16  Yes Historical Provider, MD  fentaNYL (DURAGESIC - DOSED MCG/HR) 12 MCG/HR Place 1 patch (12.5 mcg total) onto the skin every 3 (three) days. 04/03/16  Yes [name], MD  HYDROmorphone (DILAUDID) 4 MG tablet Take 1 tablet (4 mg total) by mouth every 4 (four) hours as needed for severe pain. 04/03/16  Yes [name], MD  lisinopril-hydrochlorothiazide (PRINZIDE,ZESTORETIC) 20-12.5 MG tablet Take 1 tablet by mouth daily. 01/22/16  Yes Historical Provider, MD  zolpidem (AMBIEN) 5 MG tablet Take 1 tablet (5 mg total) by mouth at bedtime. 04/01/16  Yes [name], MD  dicyclomine (BENTYL) 10 MG capsule Take 10 mg by mouth 3 (three) times daily as needed. Reported on 04/03/2016 03/14/16   Historical Provider, MD  lidocaine-prilocaine (EMLA) cream Apply a quarter size amount to port site 1 hour prior to chemo. Do not rub in. Cover with plastic wrap. 04/03/16   [name], MD  ondansetron (ZOFRAN) 4 MG/5ML solution Take 5 mLs (4 mg total) by mouth every 8 (eight) hours as needed for nausea or vomiting. 04/01/16   [name], MD    prochlorperazine (COMPAZINE) 10 MG tablet Take 1 tablet (10 mg total) by mouth every 6 (six) hours as needed for nausea or vomiting. 04/03/16   [name], MD     Vital Signs: BP 149/75 mmHg  Pulse 58  Temp(Src) 97.9 F (36.6 C) (Oral)  Resp 16  SpO2 100%  Physical Exam patient awake, alert. Chest clear to auscultation bilaterally. Heart with occasional ectopy noted, slightly bradycardic; abdomen soft, positive bowel sounds, tender epigastric region; lower extremities with no significant edema.  Imaging: Ct Chest W Contrast  03/31/2016  CLINICAL DATA:  Suspected carcinoma of pancreas metastatic to liver. Pancreatic mass and liver lesions. Evaluate for intrathoracic metastasis, staging. EXAM: CT CHEST WITH CONTRAST TECHNIQUE: Multidetector CT imaging of the chest was performed during intravenous contrast administration. CONTRAST:  47mL ISOVUE-300 IOPAMIDOL (ISOVUE-300) INJECTION 61% COMPARISON:  No prior chest exams.  Abdominal CT 03/28/2016. FINDINGS: Mediastinum/Lymph Nodes: Borderline subcarinal node measures 11 mm short axis. No additional enlarged hilar or mediastinal lymph nodes. No mediastinal mass or pericardial fluid. Atherosclerosis of normal caliber thoracic aorta. Coronary artery calcifications are seen. Lungs/Pleura: Right lower lobe 6 mm nodule image 100 series 5. 6 mm nodule right upper lobe image 40 series 5. These are concerning for metastatic disease. There are tiny subpleural nodules in both upper lobes, majority less than 2 mm better nonspecific. Linear densities in both lower lobes and lingular consistent with atelectasis. No pleural effusion. No confluent airspace disease. Upper abdomen: Multiple hepatic lesions. Air within the larger lesion, sequela of percutaneous biopsy earlier today. No evidence of  perihepatic hemorrhage. Musculoskeletal: Small sclerotic focus within T11 anteriorly. Small sclerotic focus in the distal left clavicle. There is cortical thickening evolving  multiple vertebral bodies, nonspecific. No evidence of destructive lytic lesion. IMPRESSION: 1. Right lower and right upper lobe pulmonary nodules both measuring 6 mm, suspicious for metastatic disease. Multiple tiny subpleural nodules, upper lobe predominant, are nonspecific, may be inflammatory or small intrapulmonary lymph nodes, with metastatic disease not entirely excluded. 2. Prominent subcarinal node measures 11 mm is nonspecific, otherwise no nodal metastasis. 3. Scattered small sclerotic foci, within T11 and the left clavicle. Similar sclerotic foci are seen within the abdominal pelvic osseous structures. Electronically Signed   By: Jeb Levering M.D.   On: 03/31/2016 22:26   Ct Outside Films Body  03/31/2016  CLINICAL DATA:  This exam is stored here for comparison purposes only and was performed at an outside facility.   Please contact the originating institution for any associated interpretation or report.    Labs:  CBC:  Recent Labs  03/29/16 0426 04/04/16 1246  WBC 7.8 9.3  HGB 13.8 13.8  HCT 40.8 39.7  PLT 138* 154    COAGS:  Recent Labs  03/29/16 0426 04/04/16 1246  INR 1.27 1.10  APTT  --  27    BMP:  Recent Labs  03/29/16 0426 04/04/16 1246  NA 133* 132*  K 3.2* 3.9  CL 100* 99*  CO2 25 23  GLUCOSE 82 91  BUN 15 13  CALCIUM 8.7* 9.1  CREATININE 0.96 0.85  GFRNONAA >60 >60  GFRAA >60 >60    LIVER FUNCTION TESTS:  Recent Labs  03/29/16 0426  BILITOT 0.8  AST 37  ALT 34  ALKPHOS 122  PROT 6.7  ALBUMIN 4.0    Assessment and Plan: Patient with newly diagnosed metastatic pancreatic cancer. He presents today for Port-A-Cath placement for chemotherapy.Risks and benefits discussed with the patient/wife including, but not limited to bleeding, infection, pneumothorax, or fibrin sheath development and need for additional procedures.All of the patient's questions were answered, patient is agreeable to proceed.Consent signed and in  chart.     Electronically Signed: D. Rowe Robert 04/04/2016, 1:31 PM   I spent a total of 20 minutes at the the patient's bedside AND on the patient's hospital floor or unit, greater than 50% of which was counseling/coordinating care for port a cath placement

## 2016-04-04 NOTE — Procedures (Signed)
Interventional Radiology Procedure Note  Procedure: Placement of a right IJ approach single lumen PowerPort.  Tip is positioned at the superior cavoatrial junction and catheter is ready for immediate use.  Complications: No immediate Recommendations:  - Ok to shower tomorrow - Do not submerge for 7 days - Routine line care   Signed,  Dharma Pare S. Calvert Charland, DO    

## 2016-04-04 NOTE — Discharge Instructions (Signed)
Implanted Port Home Guide °An implanted port is a type of central line that is placed under the skin. Central lines are used to provide IV access when treatment or nutrition needs to be given through a person's veins. Implanted ports are used for long-term IV access. An implanted port may be placed because:  °· You need IV medicine that would be irritating to the small veins in your hands or arms.   °· You need long-term IV medicines, such as antibiotics.   °· You need IV nutrition for a long period.   °· You need frequent blood draws for lab tests.   °· You need dialysis.   °Implanted ports are usually placed in the chest area, but they can also be placed in the upper arm, the abdomen, or the leg. An implanted port has two main parts:  °· Reservoir. The reservoir is round and will appear as a small, raised area under your skin. The reservoir is the part where a needle is inserted to give medicines or draw blood.   °· Catheter. The catheter is a thin, flexible tube that extends from the reservoir. The catheter is placed into a large vein. Medicine that is inserted into the reservoir goes into the catheter and then into the vein.   °HOW WILL I CARE FOR MY INCISION SITE? °Do not get the incision site wet. Bathe or shower as directed by your health care provider.  °HOW IS MY PORT ACCESSED? °Special steps must be taken to access the port:  °· Before the port is accessed, a numbing cream can be placed on the skin. This helps numb the skin over the port site.   °· Your health care provider uses a sterile technique to access the port. °· Your health care provider must put on a mask and sterile gloves. °· The skin over your port is cleaned carefully with an antiseptic and allowed to dry. °· The port is gently pinched between sterile gloves, and a needle is inserted into the port. °· Only "non-coring" port needles should be used to access the port. Once the port is accessed, a blood return should be checked. This helps  ensure that the port is in the vein and is not clogged.   °· If your port needs to remain accessed for a constant infusion, a clear (transparent) bandage will be placed over the needle site. The bandage and needle will need to be changed every week, or as directed by your health care provider.   °· Keep the bandage covering the needle clean and dry. Do not get it wet. Follow your health care provider's instructions on how to take a shower or bath while the port is accessed.   °· If your port does not need to stay accessed, no bandage is needed over the port.   °WHAT IS FLUSHING? °Flushing helps keep the port from getting clogged. Follow your health care provider's instructions on how and when to flush the port. Ports are usually flushed with saline solution or a medicine called heparin. The need for flushing will depend on how the port is used.  °· If the port is used for intermittent medicines or blood draws, the port will need to be flushed:   °· After medicines have been given.   °· After blood has been drawn.   °· As part of routine maintenance.   °· If a constant infusion is running, the port may not need to be flushed.   °HOW LONG WILL MY PORT STAY IMPLANTED? °The port can stay in for as long as your health care   provider thinks it is needed. When it is time for the port to come out, surgery will be done to remove it. The procedure is similar to the one performed when the port was put in.  °WHEN SHOULD I SEEK IMMEDIATE MEDICAL CARE? °When you have an implanted port, you should seek immediate medical care if:  °· You notice a bad smell coming from the incision site.   °· You have swelling, redness, or drainage at the incision site.   °· You have more swelling or pain at the port site or the surrounding area.   °· You have a fever that is not controlled with medicine. °  °This information is not intended to replace advice given to you by your health care provider. Make sure you discuss any questions you have with  your health care provider. °  °Document Released: 09/15/2005 Document Revised: 07/06/2013 Document Reviewed: 05/23/2013 °Elsevier Interactive Patient Education ©2016 Elsevier Inc[employer] °Implanted Port Insertion, Care After °Refer to this sheet in the next few weeks. These instructions provide you with information on caring for yourself after your procedure. Your health care provider may also give you more specific instructions. Your treatment has been planned according to current medical practices, but problems sometimes occur. Call your health care provider if you have any problems or questions after your procedure. °WHAT TO EXPECT AFTER THE PROCEDURE °After your procedure, it is typical to have the following:  °· Discomfort at the port insertion site. Ice packs to the area will help. °· Bruising on the skin over the port. This will subside in 3-4 days. °HOME CARE INSTRUCTIONS °· After your port is placed, you will get a manufacturer's information card. The card has information about your port. Keep this card with you at all times.   °· Know what kind of port you have. There are many types of ports available.   °· Wear a medical alert bracelet in case of an emergency. This can help alert health care workers that you have a port.   °· The port can stay in for as long as your health care provider believes it is necessary.   °· A home health care nurse may give medicines and take care of the port.   °· You or a family member can get special training and directions for giving medicine and taking care of the port at home.   °SEEK MEDICAL CARE IF:  °· Your port does not flush or you are unable to get a blood return.   °· You have a fever or chills. °SEEK IMMEDIATE MEDICAL CARE IF: °· You have new fluid or pus coming from your incision.   °· You notice a bad smell coming from your incision site.   °· You have swelling, pain, or more redness at the incision or port site.   °· You have chest pain or shortness of breath. °  °This  information is not intended to replace advice given to you by your health care provider. Make sure you discuss any questions you have with your health care provider. °  °Document Released: 07/06/2013 Document Revised: 09/20/2013 Document Reviewed: 07/06/2013 °Elsevier Interactive Patient Education ©2016 Elsevier Inc. °Moderate Conscious Sedation, Adult, Care After °Refer to this sheet in the next few weeks. These instructions provide you with information on caring for yourself after your procedure. Your health care provider may also give you more specific instructions. Your treatment has been planned according to current medical practices, but problems sometimes occur. Call your health care provider if you have any problems or questions after your   procedure. °WHAT TO EXPECT AFTER THE PROCEDURE  °After your procedure: °· You may feel sleepy, clumsy, and have poor balance for several hours. °· Vomiting may occur if you eat too soon after the procedure. °HOME CARE INSTRUCTIONS °· Do not participate in any activities where you could become injured for at least 24 hours. Do not: °¨ Drive. °¨ Swim. °¨ Ride a bicycle. °¨ Operate heavy machinery. °¨ Cook. °¨ Use power tools. °¨ Climb ladders. °¨ Work from a high place. °· Do not make important decisions or sign legal documents until you are improved. °· If you vomit, drink water, juice, or soup when you can drink without vomiting. Make sure you have little or no nausea before eating solid foods. °· Only take over-the-counter or prescription medicines for pain, discomfort, or fever as directed by your health care provider. °· Make sure you and your family fully understand everything about the medicines given to you, including what side effects may occur. °· You should not drink alcohol, take sleeping pills, or take medicines that cause drowsiness for at least 24 hours. °· If you smoke, do not smoke without supervision. °· If you are feeling better, you may resume normal  activities 24 hours after you were sedated. °· Keep all appointments with your health care provider. °SEEK MEDICAL CARE IF: °· Your skin is pale or bluish in color. °· You continue to feel nauseous or vomit. °· Your pain is getting worse and is not helped by medicine. °· You have bleeding or swelling. °· You are still sleepy or feeling clumsy after 24 hours. °SEEK IMMEDIATE MEDICAL CARE IF: °· You develop a rash. °· You have difficulty breathing. °· You develop any type of allergic problem. °· You have a fever. °MAKE SURE YOU: °· Understand these instructions. °· Will watch your condition. °· Will get help right away if you are not doing well or get worse. °  °This information is not intended to replace advice given to you by your health care provider. Make sure you discuss any questions you have with your health care provider. °  °Document Released: 07/06/2013 Document Revised: 10/06/2014 Document Reviewed: 07/06/2013 °Elsevier Interactive Patient Education ©2016 Elsevier Inc. ° °

## 2016-04-05 ENCOUNTER — Other Ambulatory Visit (HOSPITAL_COMMUNITY): Payer: Self-pay | Admitting: *Deleted

## 2016-04-05 NOTE — Patient Instructions (Signed)
[facility]   CHEMOTHERAPY INSTRUCTIONS  Premeds:  You will receive medications to decrease the risk of you having nausea/vomiting related to the chemotherapy treatment prior to receiving chemo.  Abraxane - myelosuppression (bone marrow suppression - lowers white blood cells, red blood cells, and platelets), sensory neuropathy, muscle and joint aches/pain, nausea/vomiting, diarrhea, mucositis, hair loss )   (takes 30 minutes to infuse)  Gemcitabine - bone marrow suppression (lowers white blood cells (fight infection), lowers red blood cells (make up your blood), lowers platelets (help blood to clot). Nausea/vomiting,fever, flu-like symptoms, rash. (takes 30 minutes to infuse)   We will monitor you blood counts prior to each infusion.  MEDICATIONS: You have been given prescriptions for the following medications:  Zofran/Ondansetron 8mg  tablet. Take 1 tablet every 8 hours as needed for nausea/vomiting. (#1 nausea med to take, this can constipate)  Compazine/Prochlorperazine 10mg  tablet. Take 1 tablet every 6 hours as needed for nausea/vomiting. (#2 nausea med to take, this can make you sleepy)   EMLA cream. Apply a quarter size amount to port site 1 hour prior to chemo. Do not rub in. Cover with plastic wrap.   Over-the-Counter Meds:  Miralax 1 capful in 8 oz of fluid daily. May increase to two times a day if needed. This is a stool softener. If this doesn't work proceed you can add:  Senokot S  - start with 1 tablet two times a day and increase to 4 tablets two times a day if needed. (total of 8 tablets in a 24 hour period). This is a stimulant laxative.   Call us if this does not help your bowels move.   Imodium 2mg  capsule. Take 2 capsules after the 1st loose stool and then 1 capsule every 2 hours until you go a total of 12 hours without having a loose stool. Call the [facility] if loose stools continue. If diarrhea occurs @ bedtime, take 2  capsules @ bedtime. Then take 2 capsules every 4 hours until morning. Call [facility].     SYMPTOMS TO REPORT AS SOON AS POSSIBLE AFTER TREATMENT:  FEVER GREATER THAN 100.5 F  CHILLS WITH OR WITHOUT FEVER  NAUSEA AND VOMITING THAT IS NOT CONTROLLED WITH YOUR NAUSEA MEDICATION  UNUSUAL SHORTNESS OF BREATH  UNUSUAL BRUISING OR BLEEDING  TENDERNESS IN MOUTH AND THROAT WITH OR WITHOUT PRESENCE OF ULCERS  URINARY PROBLEMS  BOWEL PROBLEMS  UNUSUAL RASH    Wear comfortable clothing and clothing appropriate for easy access to any Portacath or PICC line. Let us know if there is anything that we can do to make your therapy better!      I have been informed and understand all of the instructions given to me and have received a copy. I have been instructed to call the clinic [phone] or my family physician as soon as possible for continued medical care, if indicated. I do not have any more questions at this time but understand that I may call the [facility] or the Patient Navigator at [phone] during office hours should I have questions or need assistance in obtaining follow-up care.

## 2016-04-07 ENCOUNTER — Other Ambulatory Visit (HOSPITAL_COMMUNITY): Payer: Self-pay | Admitting: *Deleted

## 2016-04-07 DIAGNOSIS — R1013 Epigastric pain: Secondary | ICD-10-CM

## 2016-04-07 DIAGNOSIS — C259 Malignant neoplasm of pancreas, unspecified: Secondary | ICD-10-CM

## 2016-04-07 DIAGNOSIS — C787 Secondary malignant neoplasm of liver and intrahepatic bile duct: Principal | ICD-10-CM

## 2016-04-07 MED ORDER — FENTANYL 50 MCG/HR TD PT72: 50.0000 ug | MEDICATED_PATCH | TRANSDERMAL | Status: DC

## 2016-04-08 ENCOUNTER — Other Ambulatory Visit (HOSPITAL_COMMUNITY): Payer: Self-pay | Admitting: Oncology

## 2016-04-08 ENCOUNTER — Other Ambulatory Visit (HOSPITAL_COMMUNITY): Payer: Self-pay | Admitting: Hematology & Oncology

## 2016-04-09 ENCOUNTER — Other Ambulatory Visit (HOSPITAL_COMMUNITY): Payer: Self-pay | Admitting: *Deleted

## 2016-04-09 ENCOUNTER — Encounter (HOSPITAL_COMMUNITY): Payer: Medicare HMO

## 2016-04-09 ENCOUNTER — Telehealth (HOSPITAL_COMMUNITY): Payer: Self-pay | Admitting: *Deleted

## 2016-04-09 ENCOUNTER — Ambulatory Visit (HOSPITAL_COMMUNITY): Payer: Medicare HMO

## 2016-04-09 DIAGNOSIS — C259 Malignant neoplasm of pancreas, unspecified: Secondary | ICD-10-CM

## 2016-04-09 DIAGNOSIS — C787 Secondary malignant neoplasm of liver and intrahepatic bile duct: Principal | ICD-10-CM

## 2016-04-09 DIAGNOSIS — L299 Pruritus, unspecified: Secondary | ICD-10-CM | POA: Insufficient documentation

## 2016-04-09 MED ORDER — HYDROXYZINE HCL 25 MG PO TABS
25.0000 mg | ORAL_TABLET | Freq: Four times a day (QID) | ORAL | Status: AC | PRN
  Filled 2016-04-09: qty 1

## 2016-04-09 MED ORDER — HYDROXYZINE HCL 25 MG PO TABS: 25.0000 mg | ORAL_TABLET | Freq: Four times a day (QID) | ORAL | Status: AC | PRN

## 2016-04-09 NOTE — Telephone Encounter (Signed)
Patient called to talk about his bowel status. Pt had some good BMs and then some broken up piece of stool. Pt instructed to not start Imodium since it isn't diarrhea and to monitor stool output and to call me for any further problems. He said ok.

## 2016-04-10 ENCOUNTER — Ambulatory Visit (HOSPITAL_COMMUNITY): Payer: Medicare HMO | Admitting: Hematology & Oncology

## 2016-04-11 ENCOUNTER — Encounter (HOSPITAL_BASED_OUTPATIENT_CLINIC_OR_DEPARTMENT_OTHER): Payer: Medicare HMO

## 2016-04-11 VITALS — BP 125/76 | HR 66 | Temp 97.9°F | Resp 16 | Wt 185.4 lb

## 2016-04-11 DIAGNOSIS — H409 Unspecified glaucoma: Secondary | ICD-10-CM | POA: Diagnosis not present

## 2016-04-11 DIAGNOSIS — C259 Malignant neoplasm of pancreas, unspecified: Secondary | ICD-10-CM | POA: Diagnosis not present

## 2016-04-11 DIAGNOSIS — Z9889 Other specified postprocedural states: Secondary | ICD-10-CM | POA: Diagnosis not present

## 2016-04-11 DIAGNOSIS — Z5111 Encounter for antineoplastic chemotherapy: Secondary | ICD-10-CM

## 2016-04-11 DIAGNOSIS — R109 Unspecified abdominal pain: Secondary | ICD-10-CM | POA: Diagnosis not present

## 2016-04-11 DIAGNOSIS — I10 Essential (primary) hypertension: Secondary | ICD-10-CM | POA: Diagnosis not present

## 2016-04-11 DIAGNOSIS — I714 Abdominal aortic aneurysm, without rupture: Secondary | ICD-10-CM | POA: Diagnosis not present

## 2016-04-11 DIAGNOSIS — Z79899 Other long term (current) drug therapy: Secondary | ICD-10-CM | POA: Diagnosis not present

## 2016-04-11 DIAGNOSIS — C787 Secondary malignant neoplasm of liver and intrahepatic bile duct: Secondary | ICD-10-CM | POA: Diagnosis not present

## 2016-04-11 DIAGNOSIS — R5383 Other fatigue: Secondary | ICD-10-CM | POA: Diagnosis not present

## 2016-04-11 DIAGNOSIS — E46 Unspecified protein-calorie malnutrition: Secondary | ICD-10-CM | POA: Diagnosis not present

## 2016-04-11 DIAGNOSIS — K5909 Other constipation: Secondary | ICD-10-CM | POA: Diagnosis not present

## 2016-04-11 LAB — CBC WITH DIFFERENTIAL/PLATELET
BASOS PCT: 1 %
Basophils Absolute: 0 10*3/uL (ref 0.0–0.1)
EOS ABS: 1 10*3/uL — AB (ref 0.0–0.7)
Eosinophils Relative: 12 %
HCT: 37.8 % — ABNORMAL LOW (ref 39.0–52.0)
HEMOGLOBIN: 12.8 g/dL — AB (ref 13.0–17.0)
Lymphocytes Relative: 10 %
Lymphs Abs: 0.9 10*3/uL (ref 0.7–4.0)
MCH: 29.9 pg (ref 26.0–34.0)
MCHC: 33.9 g/dL (ref 30.0–36.0)
MCV: 88.3 fL (ref 78.0–100.0)
Monocytes Absolute: 0.6 10*3/uL (ref 0.1–1.0)
Monocytes Relative: 7 %
NEUTROS PCT: 70 %
Neutro Abs: 5.9 10*3/uL (ref 1.7–7.7)
Platelets: 154 10*3/uL (ref 150–400)
RBC: 4.28 MIL/uL (ref 4.22–5.81)
RDW: 13.3 % (ref 11.5–15.5)
WBC: 8.4 10*3/uL (ref 4.0–10.5)

## 2016-04-11 LAB — COMPREHENSIVE METABOLIC PANEL
ALK PHOS: 149 U/L — AB (ref 38–126)
ALT: 39 U/L (ref 17–63)
AST: 38 U/L (ref 15–41)
Albumin: 3.7 g/dL (ref 3.5–5.0)
Anion gap: 8 (ref 5–15)
BUN: 25 mg/dL — ABNORMAL HIGH (ref 6–20)
CALCIUM: 8.7 mg/dL — AB (ref 8.9–10.3)
CHLORIDE: 99 mmol/L — AB (ref 101–111)
CO2: 24 mmol/L (ref 22–32)
CREATININE: 0.85 mg/dL (ref 0.61–1.24)
Glucose, Bld: 103 mg/dL — ABNORMAL HIGH (ref 65–99)
Potassium: 3.7 mmol/L (ref 3.5–5.1)
Sodium: 131 mmol/L — ABNORMAL LOW (ref 135–145)
Total Bilirubin: 0.8 mg/dL (ref 0.3–1.2)
Total Protein: 6.4 g/dL — ABNORMAL LOW (ref 6.5–8.1)

## 2016-04-11 MED ORDER — LORAZEPAM 2 MG/ML IJ SOLN
0.5000 mg | Freq: Once | INTRAMUSCULAR | Status: AC
  Administered 2016-04-11: 0.5 mg via INTRAVENOUS
  Filled 2016-04-11: qty 1

## 2016-04-11 MED ORDER — SODIUM CHLORIDE 0.9 % IV SOLN
1000.0000 mg/m2 | Freq: Once | INTRAVENOUS | Status: AC
  Administered 2016-04-11: 2014 mg via INTRAVENOUS
  Filled 2016-04-11: qty 52.97

## 2016-04-11 MED ORDER — PALONOSETRON HCL INJECTION 0.25 MG/5ML
0.2500 mg | Freq: Once | INTRAVENOUS | Status: AC
  Administered 2016-04-11: 0.25 mg via INTRAVENOUS
  Filled 2016-04-11: qty 5

## 2016-04-11 MED ORDER — SODIUM CHLORIDE 0.9% FLUSH
10.0000 mL | INTRAVENOUS | Status: DC | PRN
  Administered 2016-04-11: 10 mL
  Filled 2016-04-11: qty 10

## 2016-04-11 MED ORDER — PACLITAXEL PROTEIN-BOUND CHEMO INJECTION 100 MG
125.0000 mg/m2 | Freq: Once | INTRAVENOUS | Status: AC
  Administered 2016-04-11: 250 mg via INTRAVENOUS
  Filled 2016-04-11: qty 50

## 2016-04-11 MED ORDER — SODIUM CHLORIDE 0.9 % IV SOLN
Freq: Once | INTRAVENOUS | Status: AC
  Administered 2016-04-11: 10:00:00 via INTRAVENOUS

## 2016-04-11 MED ORDER — ALPRAZOLAM 0.25 MG PO TABS: ORAL_TABLET | ORAL | Status: AC

## 2016-04-11 MED ORDER — HEPARIN SOD (PORK) LOCK FLUSH 100 UNIT/ML IV SOLN
500.0000 [IU] | Freq: Once | INTRAVENOUS | Status: AC | PRN
  Administered 2016-04-11: 500 [IU]
  Filled 2016-04-11: qty 5

## 2016-04-11 NOTE — Progress Notes (Signed)
Tolerated chemo well. Drowsy from ativan. Stable on discharge home via wheelchair with wife.

## 2016-04-11 NOTE — Patient Instructions (Signed)
[facility] Discharge Instructions for Patients Receiving Chemotherapy   Beginning January 23rd 2017 lab work for the [facility] will be done in the  Main lab at [facility] on 1st floor. If you have a lab appointment with the [facility] please come in thru the  Main Entrance and check in at the main information desk   Today you received the following chemotherapy agents Abraxane and Gemzar Cycle 1 Day 1.  To help prevent nausea and vomiting after your treatment, we encourage you to take your nausea medication as instructed.   If you develop nausea and vomiting, or diarrhea that is not controlled by your medication, call the clinic.  The clinic phone number is [phone]. Office hours are Monday-Friday 8:30am-5:00pm.  BELOW ARE SYMPTOMS THAT SHOULD BE REPORTED IMMEDIATELY:  *FEVER GREATER THAN 101.0 F  *CHILLS WITH OR WITHOUT FEVER  NAUSEA AND VOMITING THAT IS NOT CONTROLLED WITH YOUR NAUSEA MEDICATION  *UNUSUAL SHORTNESS OF BREATH  *UNUSUAL BRUISING OR BLEEDING  TENDERNESS IN MOUTH AND THROAT WITH OR WITHOUT PRESENCE OF ULCERS  *URINARY PROBLEMS  *BOWEL PROBLEMS  UNUSUAL RASH Items with * indicate a potential emergency and should be followed up as soon as possible. If you have an emergency after office hours please contact your primary care physician or go to the nearest emergency department.  Please call the clinic during office hours if you have any questions or concerns.   You may also contact the Patient Navigator at [phone] should you have any questions or need assistance in obtaining follow up care.      Resources For Cancer Patients and their Caregivers ? American Cancer Society: Can assist with transportation, wigs, general needs, runs Look Good Feel Better.        740-506-6314 ? Cancer Care: Provides financial assistance, online support groups, medication/co-pay assistance.  1-800-813-HOPE (941) 381-1750) ? [facility] Assists [place] cancer patients and their families through emotional , educational and financial support.  7263923186 ? [place] DSS Where to apply for food stamps, Medicaid and utility assistance. 617-732-7524 ? [facility]: Transportation to medical appointments. (807)741-1838 ? Social Security Administration: May apply for disability if have a Stage IV cancer. 502-144-1405 970-386-1552 ? [employer], Disability and Transit Services: Assists with nutrition, care and transit needs. [phone]

## 2016-04-15 ENCOUNTER — Telehealth (HOSPITAL_COMMUNITY): Payer: Self-pay | Admitting: *Deleted

## 2016-04-15 NOTE — Telephone Encounter (Signed)
Spoke with patient regarding constipation. Per Dr.Penland, instruct patient to use enema x 1, may repeat if needed. Instruct to use magnesium citrate 2T in 6 ounces gingerale/other fluid every 1 hour until results. Instructed if no BM by the am go to ED. Patient verbalized understanding.

## 2016-04-15 NOTE — Telephone Encounter (Signed)
Addressed by Dr. Whitney Muse and communicated to nursing.  Robynn Pane, PA-C 04/15/2016 4:17 PM

## 2016-04-15 NOTE — Telephone Encounter (Signed)
No answer. Left message to call clinic if any questions/concerns.

## 2016-04-15 NOTE — Telephone Encounter (Signed)
Spoke with patient. Concerned that he hasn't had a bowel movement since before chemo. Reports he is drinking protein shakes three times daily and at least 64 ounces of fluid daily. Reports he is not eating a lot of solid food. Reports only mild nausea and has only used zofran a couple of times as well as only used compazine a couple of times. Reports using Miralax 1 capful 2 times daily as well as senokot tablets anywhere from 2 tablets up to 8 tablets yesterday. Only vomited x 1 after drinking a milkshake on Sunday. Passing gas. States abdomen is not really distended as he is able to "mash" on it and it doesn't hurt him and feels somewhat soft like it should. He is open to any suggestion to get his bowels moving.

## 2016-04-16 ENCOUNTER — Telehealth (HOSPITAL_COMMUNITY): Payer: Self-pay | Admitting: *Deleted

## 2016-04-16 NOTE — Telephone Encounter (Signed)
Spoke with patient. Reports he had a bowel movement yesterday after enema and magnesium citrate.

## 2016-04-18 ENCOUNTER — Ambulatory Visit (HOSPITAL_COMMUNITY): Payer: Medicare HMO

## 2016-04-18 ENCOUNTER — Ambulatory Visit (HOSPITAL_COMMUNITY)
Admission: RE | Admit: 2016-04-18 | Discharge: 2016-04-18 | Disposition: A | Discharge status: Home / Self Care | Payer: Medicare HMO | Source: Ambulatory Visit | Attending: Hematology & Oncology | Admitting: Hematology & Oncology

## 2016-04-18 ENCOUNTER — Other Ambulatory Visit (HOSPITAL_COMMUNITY): Payer: Self-pay | Admitting: Hematology & Oncology

## 2016-04-18 ENCOUNTER — Encounter (HOSPITAL_BASED_OUTPATIENT_CLINIC_OR_DEPARTMENT_OTHER): Payer: Medicare HMO

## 2016-04-18 VITALS — BP 136/72 | HR 62 | Temp 98.0°F | Resp 18

## 2016-04-18 DIAGNOSIS — C259 Malignant neoplasm of pancreas, unspecified: Secondary | ICD-10-CM

## 2016-04-18 DIAGNOSIS — C787 Secondary malignant neoplasm of liver and intrahepatic bile duct: Secondary | ICD-10-CM | POA: Insufficient documentation

## 2016-04-18 DIAGNOSIS — K5900 Constipation, unspecified: Secondary | ICD-10-CM

## 2016-04-18 DIAGNOSIS — R1084 Generalized abdominal pain: Secondary | ICD-10-CM | POA: Diagnosis not present

## 2016-04-18 DIAGNOSIS — Z5111 Encounter for antineoplastic chemotherapy: Secondary | ICD-10-CM | POA: Diagnosis not present

## 2016-04-18 LAB — CBC WITH DIFFERENTIAL/PLATELET
BASOS ABS: 0 10*3/uL (ref 0.0–0.1)
BASOS PCT: 0 %
EOS ABS: 0.3 10*3/uL (ref 0.0–0.7)
EOS PCT: 5 %
HCT: 32.2 % — ABNORMAL LOW (ref 39.0–52.0)
HEMOGLOBIN: 11 g/dL — AB (ref 13.0–17.0)
LYMPHS ABS: 0.8 10*3/uL (ref 0.7–4.0)
Lymphocytes Relative: 12 %
MCH: 29.5 pg (ref 26.0–34.0)
MCHC: 34.2 g/dL (ref 30.0–36.0)
MCV: 86.3 fL (ref 78.0–100.0)
Monocytes Absolute: 0.4 10*3/uL (ref 0.1–1.0)
Monocytes Relative: 7 %
NEUTROS PCT: 76 %
Neutro Abs: 4.7 10*3/uL (ref 1.7–7.7)
PLATELETS: 108 10*3/uL — AB (ref 150–400)
RBC: 3.73 MIL/uL — AB (ref 4.22–5.81)
RDW: 13 % (ref 11.5–15.5)
WBC: 6.3 10*3/uL (ref 4.0–10.5)

## 2016-04-18 LAB — COMPREHENSIVE METABOLIC PANEL
ALT: 76 U/L — AB (ref 17–63)
AST: 71 U/L — ABNORMAL HIGH (ref 15–41)
Albumin: 3.5 g/dL (ref 3.5–5.0)
Alkaline Phosphatase: 167 U/L — ABNORMAL HIGH (ref 38–126)
Anion gap: 7 (ref 5–15)
BILIRUBIN TOTAL: 0.8 mg/dL (ref 0.3–1.2)
BUN: 43 mg/dL — AB (ref 6–20)
CHLORIDE: 93 mmol/L — AB (ref 101–111)
CO2: 27 mmol/L (ref 22–32)
CREATININE: 1.06 mg/dL (ref 0.61–1.24)
Calcium: 8.5 mg/dL — ABNORMAL LOW (ref 8.9–10.3)
Glucose, Bld: 114 mg/dL — ABNORMAL HIGH (ref 65–99)
Potassium: 3.5 mmol/L (ref 3.5–5.1)
SODIUM: 127 mmol/L — AB (ref 135–145)
TOTAL PROTEIN: 6.3 g/dL — AB (ref 6.5–8.1)

## 2016-04-18 MED ORDER — HEPARIN SOD (PORK) LOCK FLUSH 100 UNIT/ML IV SOLN
500.0000 [IU] | Freq: Once | INTRAVENOUS | Status: AC | PRN
  Administered 2016-04-18: 500 [IU]
  Filled 2016-04-18: qty 5

## 2016-04-18 MED ORDER — LORAZEPAM 2 MG/ML IJ SOLN
0.5000 mg | Freq: Once | INTRAMUSCULAR | Status: AC
  Administered 2016-04-18: 0.5 mg via INTRAVENOUS
  Filled 2016-04-18: qty 1

## 2016-04-18 MED ORDER — HYDROMORPHONE HCL 4 MG PO TABS: 4.0000 mg | ORAL_TABLET | ORAL | Status: AC | PRN

## 2016-04-18 MED ORDER — PALONOSETRON HCL INJECTION 0.25 MG/5ML
0.2500 mg | Freq: Once | INTRAVENOUS | Status: AC
  Administered 2016-04-18: 0.25 mg via INTRAVENOUS
  Filled 2016-04-18: qty 5

## 2016-04-18 MED ORDER — SODIUM CHLORIDE 0.9 % IV SOLN
Freq: Once | INTRAVENOUS | Status: AC
  Administered 2016-04-18: 10:00:00 via INTRAVENOUS

## 2016-04-18 MED ORDER — PACLITAXEL PROTEIN-BOUND CHEMO INJECTION 100 MG
125.0000 mg/m2 | Freq: Once | INTRAVENOUS | Status: AC
  Administered 2016-04-18: 250 mg via INTRAVENOUS
  Filled 2016-04-18: qty 50

## 2016-04-18 MED ORDER — LINACLOTIDE 145 MCG PO CAPS: 145.0000 ug | ORAL_CAPSULE | Freq: Every day | ORAL | Status: AC

## 2016-04-18 MED ORDER — SODIUM CHLORIDE 0.9 % IV SOLN
1000.0000 mg/m2 | Freq: Once | INTRAVENOUS | Status: AC
  Administered 2016-04-18: 2014 mg via INTRAVENOUS
  Filled 2016-04-18: qty 53

## 2016-04-18 MED ORDER — SODIUM CHLORIDE 0.9% FLUSH: 10.0000 mL | INTRAVENOUS | Status: DC | PRN

## 2016-04-18 NOTE — Patient Instructions (Signed)
[facility] Discharge Instructions for Patients Receiving Chemotherapy   Beginning January 23rd 2017 lab work for the Cancer Center will be done in the  Main lab at [place] on 1st floor. If you have a lab appointment with the Cancer Center please come in thru the  Main Entrance and check in at the main information desk   Today you received the following chemotherapy agents:  Abraxane and Gemzar.  If you develop nausea and vomiting, or diarrhea that is not controlled by your medication, call the clinic.  The clinic phone number is [phone]. Office hours are Monday-Friday 8:30am-5:00pm.  BELOW ARE SYMPTOMS THAT SHOULD BE REPORTED IMMEDIATELY:  *FEVER GREATER THAN 101.0 F  *CHILLS WITH OR WITHOUT FEVER  NAUSEA AND VOMITING THAT IS NOT CONTROLLED WITH YOUR NAUSEA MEDICATION  *UNUSUAL SHORTNESS OF BREATH  *UNUSUAL BRUISING OR BLEEDING  TENDERNESS IN MOUTH AND THROAT WITH OR WITHOUT PRESENCE OF ULCERS  *URINARY PROBLEMS  *BOWEL PROBLEMS  UNUSUAL RASH Items with * indicate a potential emergency and should be followed up as soon as possible. If you have an emergency after office hours please contact your primary care physician or go to the nearest emergency department.  Please call the clinic during office hours if you have any questions or concerns.   You may also contact the Patient Navigator at [phone] should you have any questions or need assistance in obtaining follow up care.      Resources For Cancer Patients and their Caregivers ? American Cancer Society: Can assist with transportation, wigs, general needs, runs Look Good Feel Better.        1-888-227-6333 ? Cancer Care: Provides financial assistance, online support groups, medication/co-pay assistance.  1-800-813-HOPE (4673) ? [facility] Assists [place] cancer patients and their families through emotional , educational and financial support.   336-427-4357 ? [place] DSS Where to apply for food stamps, Medicaid and utility assistance. 336-342-1394 ? RCATS: Transportation to medical appointments. 336-347-2287 ? Social Security Administration: May apply for disability if have a Stage IV cancer. 336-342-7796 1-800-772-1213 ? [place] Aging, Disability and Transit Services: Assists with nutrition, care and transit needs. 336-349-2343         

## 2016-04-18 NOTE — Progress Notes (Signed)
Patient tolerated infusion well.  VSS.  Patient was informed that Linzess was called into the pharmacy.

## 2016-04-21 ENCOUNTER — Encounter (HOSPITAL_COMMUNITY): Payer: Self-pay | Admitting: Emergency Medicine

## 2016-04-21 ENCOUNTER — Emergency Department (HOSPITAL_COMMUNITY): Payer: Medicare HMO

## 2016-04-21 ENCOUNTER — Emergency Department (HOSPITAL_COMMUNITY)
Admission: EM | Admit: 2016-04-21 | Discharge: 2016-04-21 | Disposition: A | Discharge status: Home / Self Care | Payer: Medicare HMO | Attending: Emergency Medicine | Admitting: Emergency Medicine

## 2016-04-21 DIAGNOSIS — R1110 Vomiting, unspecified: Secondary | ICD-10-CM | POA: Diagnosis not present

## 2016-04-21 DIAGNOSIS — R1084 Generalized abdominal pain: Secondary | ICD-10-CM | POA: Insufficient documentation

## 2016-04-21 DIAGNOSIS — Z87891 Personal history of nicotine dependence: Secondary | ICD-10-CM | POA: Insufficient documentation

## 2016-04-21 DIAGNOSIS — I10 Essential (primary) hypertension: Secondary | ICD-10-CM | POA: Diagnosis not present

## 2016-04-21 DIAGNOSIS — Z8507 Personal history of malignant neoplasm of pancreas: Secondary | ICD-10-CM | POA: Diagnosis not present

## 2016-04-21 DIAGNOSIS — Z79899 Other long term (current) drug therapy: Secondary | ICD-10-CM | POA: Diagnosis not present

## 2016-04-21 LAB — COMPREHENSIVE METABOLIC PANEL
ALBUMIN: 3.3 g/dL — AB (ref 3.5–5.0)
ALK PHOS: 143 U/L — AB (ref 38–126)
ALT: 252 U/L — ABNORMAL HIGH (ref 17–63)
ANION GAP: 9 (ref 5–15)
AST: 182 U/L — ABNORMAL HIGH (ref 15–41)
BILIRUBIN TOTAL: 1.6 mg/dL — AB (ref 0.3–1.2)
BUN: 38 mg/dL — ABNORMAL HIGH (ref 6–20)
CALCIUM: 8.1 mg/dL — AB (ref 8.9–10.3)
CO2: 23 mmol/L (ref 22–32)
Chloride: 95 mmol/L — ABNORMAL LOW (ref 101–111)
Creatinine, Ser: 1.16 mg/dL (ref 0.61–1.24)
GFR calc non Af Amer: >60 mL/min (ref >60)
Glucose, Bld: 120 mg/dL — ABNORMAL HIGH (ref 65–99)
Potassium: 3.5 mmol/L (ref 3.5–5.1)
Sodium: 127 mmol/L — ABNORMAL LOW (ref 135–145)
TOTAL PROTEIN: 6.2 g/dL — AB (ref 6.5–8.1)

## 2016-04-21 LAB — CBC WITH DIFFERENTIAL/PLATELET
Basophils Absolute: 0 10*3/uL (ref 0.0–0.1)
Basophils Relative: 0 %
Eosinophils Absolute: 0.2 10*3/uL (ref 0.0–0.7)
Eosinophils Relative: 2 %
HEMATOCRIT: 30.9 % — AB (ref 39.0–52.0)
HEMOGLOBIN: 10.7 g/dL — AB (ref 13.0–17.0)
LYMPHS ABS: 0.3 10*3/uL — AB (ref 0.7–4.0)
Lymphocytes Relative: 3 %
MCH: 30.1 pg (ref 26.0–34.0)
MCHC: 34.6 g/dL (ref 30.0–36.0)
MCV: 87 fL (ref 78.0–100.0)
MONOS PCT: 1 %
Monocytes Absolute: 0.1 10*3/uL (ref 0.1–1.0)
NEUTROS ABS: 9 10*3/uL — AB (ref 1.7–7.7)
NEUTROS PCT: 95 %
Platelets: 59 10*3/uL — ABNORMAL LOW (ref 150–400)
RBC: 3.55 MIL/uL — ABNORMAL LOW (ref 4.22–5.81)
RDW: 12.7 % (ref 11.5–15.5)
WBC: 9.5 10*3/uL (ref 4.0–10.5)

## 2016-04-21 LAB — LIPASE, BLOOD: Lipase: 24 U/L (ref 11–51)

## 2016-04-21 MED ORDER — HEPARIN SOD (PORK) LOCK FLUSH 100 UNIT/ML IV SOLN
500.0000 [IU] | Freq: Once | INTRAVENOUS | Status: AC
  Administered 2016-04-21: 500 [IU]

## 2016-04-21 MED ORDER — ONDANSETRON 4 MG PO TBDP: ORAL_TABLET | ORAL | 0 refills | Status: DC

## 2016-04-21 MED ORDER — HEPARIN SOD (PORK) LOCK FLUSH 100 UNIT/ML IV SOLN
INTRAVENOUS | Status: AC
  Filled 2016-04-21: qty 5

## 2016-04-21 MED ORDER — SODIUM CHLORIDE 0.9 % IV SOLN
Freq: Once | INTRAVENOUS | Status: AC
  Administered 2016-04-21: 12:00:00 via INTRAVENOUS

## 2016-04-21 MED ORDER — SODIUM CHLORIDE 0.9 % IV BOLUS (SEPSIS)
1000.0000 mL | Freq: Once | INTRAVENOUS | Status: AC
  Administered 2016-04-21: 1000 mL via INTRAVENOUS

## 2016-04-21 MED ORDER — ONDANSETRON HCL 4 MG/2ML IJ SOLN
4.0000 mg | Freq: Once | INTRAMUSCULAR | Status: AC
  Administered 2016-04-21: 4 mg via INTRAVENOUS
  Filled 2016-04-21: qty 2

## 2016-04-21 NOTE — ED Notes (Signed)
Pt has a 74mcg fentanyl patch on his right upper arm (it was placed 7/23 @19 :30)

## 2016-04-21 NOTE — ED Provider Notes (Signed)
[place] DEPT Provider Note   CSN: [id number] Arrival date & time: 04/21/16  0825 By signing my name below, I, Ricky Gay, attest that this documentation has been prepared under the direction and in the presence of Ricky Ferguson, MD. Electronically Signed: Judithann Gay, ED Scribe. 04/21/16. 8:54 AM.   History   Chief Complaint Chief Complaint  Patient presents with  . Emesis    HPI Comments: Ricky Gay is a 70 y.o. male with a hx of pancreatic cancer (dx 03/28/16) and hypertension who presents to the Emergency Department complaining of multiple episodes of moderate non-bloody vomiting onset yesterday. He reports associated mild generalized abdominal pain. He adds that he has not been able to keep any of his meals down. No alleviating factors noted. Pt has tried Zofran every 6 hours with no relief. He is currently receiving chemo, last treatment was 3 days ago and pt has received 2 chemo treatments since being diagnosed. No sick contacts noted. He denies any fever, chills, or diarrhea.    The history is provided by the patient. No language interpreter was used.  Emesis   This is a new problem. The current episode started yesterday. The problem occurs 2 to 4 times per day. The problem has been gradually worsening. The emesis has an appearance of stomach contents. There has been no fever. Associated symptoms include abdominal pain. Pertinent negatives include no cough, no diarrhea and no headaches. Risk factors: chemo tx.    Past Medical History:  Diagnosis Date  . AAA (abdominal aortic aneurysm) ([place])   . Chronic constipation   . Glaucoma   . Hypertension   . Malnutrition (Ricky Gay)   . Pancreatic cancer Ricky Gay)     Patient Active Problem List   Diagnosis Date Noted  . Itching 04/09/2016  . Moderate malnutrition ([name]) 04/01/2016  . Chronic constipation 04/01/2016  . AAA (abdominal aortic aneurysm) without rupture (Ricky Gay) 04/01/2016  . Pancreatic cancer metastasized to  liver (Ricky Gay) 03/28/2016  . HTN (hypertension) 03/28/2016  . Abdominal pain, epigastric 03/28/2016  . Pancreatic mass 03/28/2016    Past Surgical History:  Procedure Laterality Date  . EYE SURGERY         Home Medications    Prior to Admission medications   Medication Sig Start Date End Date Taking? Authorizing Provider  ALPRAZolam Ricky Gay) 0.25 MG tablet Take 1 to 2 tablets every 8 hours as needed for anxiety 04/11/16   Ricky Ranks, MD  dicyclomine (BENTYL) 10 MG capsule Take 10 mg by mouth 3 (three) times daily as needed. Reported on 04/03/2016 03/14/16   Historical Provider, MD  dorzolamide (TRUSOPT) 2 % ophthalmic solution Place 1 drop into both eyes 3 (three) times daily. Days 1, 8, 15 every 28 days 03/27/16   Historical Provider, MD  fentaNYL (DURAGESIC - DOSED MCG/HR) 50 MCG/HR Place 1 patch (50 mcg total) onto the skin every 3 (three) days. 04/07/16   Ricky Ranks, MD  Gemcitabine HCl (GEMZAR IV) Inject into the vein.    Historical Provider, MD  HYDROmorphone (DILAUDID) 4 MG tablet Take 1 tablet (4 mg total) by mouth every 4 (four) hours as needed for severe pain. 04/18/16   Ricky Ranks, MD  hydrOXYzine (ATARAX/VISTARIL) 25 MG tablet Take 1 tablet (25 mg total) by mouth every 6 (six) hours as needed for itching. 04/09/16   Ricky Ranks, MD  lidocaine-prilocaine (EMLA) cream Apply a quarter size amount to port site 1 hour prior to chemo. Do not rub in. Cover with  plastic wrap. 04/03/16   Ricky Ranks, MD  linaclotide [name]) 145 MCG CAPS capsule Take 1 capsule (145 mcg total) by mouth daily before breakfast. 04/18/16   Ricky Ranks, MD  lisinopril-hydrochlorothiazide (PRINZIDE,ZESTORETIC) 20-12.5 MG tablet Take 1 tablet by mouth daily. 01/22/16   Historical Provider, MD  ondansetron (ZOFRAN) 4 MG/5ML solution Take 5 mLs (4 mg total) by mouth every 8 (eight) hours as needed for nausea or vomiting. 04/01/16   Ricky Brod, MD  PACLitaxel Protein-Bound Part  (ABRAXANE IV) Inject into the vein. Days 1, 8, 15 every 28 days    Historical Provider, MD  prochlorperazine (COMPAZINE) 10 MG tablet Take 1 tablet (10 mg total) by mouth every 6 (six) hours as needed for nausea or vomiting. 04/03/16   Ricky Ranks, MD  zolpidem (AMBIEN) 5 MG tablet Take 1 tablet (5 mg total) by mouth at bedtime. 04/01/16   Ricky Brod, MD    Family History Family History  Problem Relation Age of Onset  . Dementia Mother   . CAD Father   . Stroke Father   . Cancer Other   . Diabetes Neg Hx     Social History Social History  Substance Use Topics  . Smoking status: Former Research scientist (life sciences)  . Smokeless tobacco: Never Used  . Alcohol use Yes     Comment: once a week     Allergies   Bactrim [sulfamethoxazole-trimethoprim]   Review of Systems Review of Systems  Constitutional: Negative for appetite change and fatigue.  HENT: Negative for congestion, ear discharge and sinus pressure.   Eyes: Negative for discharge.  Respiratory: Negative for cough.   Cardiovascular: Negative for chest pain.  Gastrointestinal: Positive for abdominal pain and vomiting. Negative for diarrhea.  Genitourinary: Negative for frequency and hematuria.  Musculoskeletal: Negative for back pain.  Skin: Negative for rash.  Neurological: Negative for seizures and headaches.  Psychiatric/Behavioral: Negative for hallucinations.     Physical Exam Updated Vital Signs BP 149/80 (BP Location: Left Arm)   Pulse 95   Temp 97.5 F (36.4 C) (Oral)   Resp 20   SpO2 100%   Physical Exam  Constitutional: He is oriented to person, place, and time. He appears well-developed.  HENT:  Head: Normocephalic.  Dry mucous membranes  Eyes: Conjunctivae and EOM are normal. No scleral icterus.  Neck: Neck supple. No thyromegaly present.  Cardiovascular: Normal rate and regular rhythm.  Exam reveals no gallop and no friction rub.   No murmur heard. Pulmonary/Chest: No stridor. He has no wheezes. He has  no rales. He exhibits no tenderness.  Abdominal: He exhibits no distension. There is tenderness. There is no rebound.  Minimal tenderness all throughout abdomen  Musculoskeletal: Normal range of motion. He exhibits no edema.  Lymphadenopathy:    He has no cervical adenopathy.  Neurological: He is oriented to person, place, and time. He exhibits normal muscle tone. Coordination normal.  Skin: No rash noted. No erythema.  Psychiatric: He has a normal mood and affect. His behavior is normal.     ED Treatments / Results  DIAGNOSTIC STUDIES: Oxygen Saturation is 100% on RA, normal by my interpretation.    COORDINATION OF CARE: 8:51 AM- Pt advised of plan for treatment and pt agrees. Pt will receive IV fluids and Zofran IM. He will also receive lab work for further evaluation.    Labs (all labs ordered are listed, but only abnormal results are displayed) Labs Reviewed  CBC WITH DIFFERENTIAL/PLATELET  COMPREHENSIVE METABOLIC  PANEL    EKG  EKG Interpretation None       Radiology No results found.  Procedures Procedures (including critical care time)  Medications Ordered in ED Medications  sodium chloride 0.9 % bolus 1,000 mL (not administered)  ondansetron (ZOFRAN) injection 4 mg (not administered)     Initial Impression / Assessment and Plan / ED Course  Ricky Ferguson, MD has reviewed the triage vital signs and the nursing notes.  Pertinent labs & imaging results that were available during my care of the patient were reviewed by me and considered in my medical decision making (see chart for details).  Clinical Course    Patient with vomiting from chemotherapy. Patient improved with IV fluids and is given Zofran ODT. She has appointment to see the oncologist on Friday  Final Clinical Impressions(s) / ED Diagnoses   Final diagnoses:  None   The chart was scribed for me under my direct supervision.  I personally performed the history, physical, and medical decision  making and all procedures in the evaluation of this patient..   New Prescriptions New Prescriptions   No medications on file     Ricky Ferguson, MD 04/21/16 1420

## 2016-04-21 NOTE — ED Triage Notes (Signed)
Pt reports was diagnosed with liver and pancreatic cancer on June 30th. Pt reports receives chemo weekly, last treatment on Friday. Pt reports felt fine Saturday and reports has not been able to keep anything down since Sunday. Generalized weakness noted at this time.

## 2016-04-21 NOTE — Discharge Instructions (Signed)
Follow up Friday as planned.   Take your medicine for constipation

## 2016-04-22 ENCOUNTER — Encounter (HOSPITAL_COMMUNITY): Payer: Self-pay

## 2016-04-25 ENCOUNTER — Encounter (HOSPITAL_COMMUNITY): Payer: Self-pay

## 2016-04-25 ENCOUNTER — Encounter (HOSPITAL_BASED_OUTPATIENT_CLINIC_OR_DEPARTMENT_OTHER): Payer: Medicare HMO

## 2016-04-25 DIAGNOSIS — C259 Malignant neoplasm of pancreas, unspecified: Secondary | ICD-10-CM | POA: Diagnosis not present

## 2016-04-25 DIAGNOSIS — C787 Secondary malignant neoplasm of liver and intrahepatic bile duct: Secondary | ICD-10-CM

## 2016-04-25 DIAGNOSIS — R1013 Epigastric pain: Secondary | ICD-10-CM

## 2016-04-25 LAB — CBC WITH DIFFERENTIAL/PLATELET
BASOS PCT: 0 %
Basophils Absolute: 0 10*3/uL (ref 0.0–0.1)
EOS ABS: 0.1 10*3/uL (ref 0.0–0.7)
Eosinophils Relative: 2 %
HCT: 28.1 % — ABNORMAL LOW (ref 39.0–52.0)
HEMOGLOBIN: 9.9 g/dL — AB (ref 13.0–17.0)
LYMPHS ABS: 0.6 10*3/uL — AB (ref 0.7–4.0)
Lymphocytes Relative: 14 %
MCH: 30.3 pg (ref 26.0–34.0)
MCHC: 35.2 g/dL (ref 30.0–36.0)
MCV: 85.9 fL (ref 78.0–100.0)
MONO ABS: 0.5 10*3/uL (ref 0.1–1.0)
MONOS PCT: 11 %
NEUTROS PCT: 73 %
Neutro Abs: 3.3 10*3/uL (ref 1.7–7.7)
Platelets: 70 10*3/uL — ABNORMAL LOW (ref 150–400)
RBC: 3.27 MIL/uL — ABNORMAL LOW (ref 4.22–5.81)
RDW: 13.2 % (ref 11.5–15.5)
WBC: 4.6 10*3/uL (ref 4.0–10.5)

## 2016-04-25 LAB — COMPREHENSIVE METABOLIC PANEL
ALBUMIN: 3.2 g/dL — AB (ref 3.5–5.0)
ALK PHOS: 150 U/L — AB (ref 38–126)
ALT: 175 U/L — AB (ref 17–63)
AST: 101 U/L — AB (ref 15–41)
Anion gap: 7 (ref 5–15)
BUN: 40 mg/dL — AB (ref 6–20)
CALCIUM: 7.8 mg/dL — AB (ref 8.9–10.3)
CHLORIDE: 99 mmol/L — AB (ref 101–111)
CO2: 21 mmol/L — AB (ref 22–32)
CREATININE: 1.37 mg/dL — AB (ref 0.61–1.24)
GFR calc non Af Amer: 51 mL/min — ABNORMAL LOW (ref >60)
GFR, EST AFRICAN AMERICAN: 59 mL/min — AB (ref >60)
GLUCOSE: 129 mg/dL — AB (ref 65–99)
Potassium: 3.1 mmol/L — ABNORMAL LOW (ref 3.5–5.1)
SODIUM: 127 mmol/L — AB (ref 135–145)
Total Bilirubin: 1 mg/dL (ref 0.3–1.2)
Total Protein: 5.6 g/dL — ABNORMAL LOW (ref 6.5–8.1)

## 2016-04-25 MED ORDER — FENTANYL 50 MCG/HR TD PT72: 50.0000 ug | MEDICATED_PATCH | TRANSDERMAL | 0 refills | Status: AC

## 2016-04-25 MED ORDER — ONDANSETRON 4 MG PO TBDP: ORAL_TABLET | ORAL | 0 refills | Status: DC

## 2016-04-25 MED ORDER — HEPARIN SOD (PORK) LOCK FLUSH 100 UNIT/ML IV SOLN
500.0000 [IU] | Freq: Once | INTRAVENOUS | Status: AC
  Administered 2016-04-25: 500 [IU] via INTRAVENOUS

## 2016-04-25 NOTE — Patient Instructions (Signed)
[place]icky Gay at [facility] Discharge Instructions  RECOMMENDATIONS MADE BY THE CONSULTANT AND ANY TEST RESULTS WILL BE SENT TO YOUR REFERRING PHYSICIAN.  Treatment held today. Fentanyl patch and Zofran prescriptions refilled. Return for chemotherapy next week.   Thank you for choosing [place] at [facility] to provide your oncology and hematology care.  To afford each patient quality time with our provider, please arrive at least 15 minutes before your scheduled appointment time.   Beginning January 23rd 2017 lab work for the [facility] will be done in the  Main lab at [facility] on 1st floor. If you have a lab appointment with the [facility] please come in thru the  Main Entrance and check in at the main information desk  You need to re-schedule your appointment should you arrive 10 or more minutes late.  We strive to give you quality time with our providers, and arriving late affects you and other patients whose appointments are after yours.  Also, if you no show three or more times for appointments you may be dismissed from the clinic at the providers discretion.     Again, thank you for choosing [facility].  Our hope is that these requests will decrease the amount of time that you wait before being seen by our physicians.       _____________________________________________________________  Should you have questions after your visit to [facility], please contact our office at [phone] between the hours of 8:30 a.m. and 4:30 p.m.  Voicemails left after 4:30 p.m. will not be returned until the following business day.  For prescription refill requests, have your pharmacy contact our office.         Resources For Cancer Patients and their Caregivers ? American Cancer Society: Can assist with transportation, wigs, general needs, runs Look Good Feel Better.        [phone] ? Cancer  Care: Provides financial assistance, online support groups, medication/co-pay assistance.  1-800-813-HOPE 3605678382) ? [facility] Assists [place] cancer patients and their families through emotional , educational and financial support.  (440)222-8521 ? [place] DSS Where to apply for food stamps, Medicaid and utility assistance. (310) 310-9958 ? RCATS: Transportation to medical appointments. (251) 223-0380 ? Social Security Administration: May apply for disability if have a Stage IV cancer. 760 675 4091 386-635-8380 ? [employer], Disability and Transit Services: Assists with nutrition, care and transit needs. [place] Support Programs: @10RELATIVEDAYS @ > Cancer Support Group  2nd Tuesday of the month 1pm-2pm, Journey Room  > Creative Journey  3rd Tuesday of the month 1130am-1pm, Journey Room  > Look Good Feel Better  1st Wednesday of the month 10am-12 noon, Journey Room (Call [place] to register [phone])

## 2016-04-25 NOTE — Progress Notes (Signed)
Dr. Whitney Muse notified of platelet count of 70,000.  Defer tx x 1 week per MD.

## 2016-04-27 ENCOUNTER — Encounter (HOSPITAL_COMMUNITY): Payer: Self-pay | Admitting: Hematology & Oncology

## 2016-04-28 ENCOUNTER — Other Ambulatory Visit (HOSPITAL_COMMUNITY): Payer: Self-pay | Admitting: *Deleted

## 2016-04-28 ENCOUNTER — Other Ambulatory Visit (HOSPITAL_COMMUNITY): Payer: Self-pay | Admitting: Hematology & Oncology

## 2016-04-28 ENCOUNTER — Telehealth (HOSPITAL_COMMUNITY): Payer: Self-pay | Admitting: Hematology & Oncology

## 2016-04-28 MED ORDER — POT BICARB-POT CHLORIDE 25 MEQ PO TBEF: 20.0000 meq | EFFERVESCENT_TABLET | Freq: Two times a day (BID) | ORAL | 2 refills | Status: AC

## 2016-04-28 NOTE — Telephone Encounter (Signed)
PA ENTERED ON COVER MY MEDS FOR EFFER K TABS

## 2016-04-30 ENCOUNTER — Encounter (HOSPITAL_COMMUNITY): Payer: Medicare HMO | Attending: Hematology & Oncology | Admitting: Hematology & Oncology

## 2016-04-30 ENCOUNTER — Encounter (HOSPITAL_BASED_OUTPATIENT_CLINIC_OR_DEPARTMENT_OTHER): Payer: Medicare HMO

## 2016-04-30 ENCOUNTER — Encounter (HOSPITAL_COMMUNITY): Payer: Self-pay | Admitting: Hematology & Oncology

## 2016-04-30 VITALS — BP 105/54 | HR 56 | Temp 97.7°F | Resp 16 | Wt 171.3 lb

## 2016-04-30 VITALS — BP 110/62 | HR 64 | Temp 97.7°F | Resp 18

## 2016-04-30 DIAGNOSIS — C259 Malignant neoplasm of pancreas, unspecified: Secondary | ICD-10-CM

## 2016-04-30 DIAGNOSIS — K5909 Other constipation: Secondary | ICD-10-CM | POA: Diagnosis not present

## 2016-04-30 DIAGNOSIS — Z9889 Other specified postprocedural states: Secondary | ICD-10-CM | POA: Diagnosis not present

## 2016-04-30 DIAGNOSIS — I10 Essential (primary) hypertension: Secondary | ICD-10-CM | POA: Diagnosis not present

## 2016-04-30 DIAGNOSIS — G893 Neoplasm related pain (acute) (chronic): Secondary | ICD-10-CM

## 2016-04-30 DIAGNOSIS — E46 Unspecified protein-calorie malnutrition: Secondary | ICD-10-CM | POA: Insufficient documentation

## 2016-04-30 DIAGNOSIS — C787 Secondary malignant neoplasm of liver and intrahepatic bile duct: Secondary | ICD-10-CM | POA: Diagnosis not present

## 2016-04-30 DIAGNOSIS — F418 Other specified anxiety disorders: Secondary | ICD-10-CM

## 2016-04-30 DIAGNOSIS — R5383 Other fatigue: Secondary | ICD-10-CM | POA: Insufficient documentation

## 2016-04-30 DIAGNOSIS — R112 Nausea with vomiting, unspecified: Secondary | ICD-10-CM | POA: Diagnosis not present

## 2016-04-30 DIAGNOSIS — H409 Unspecified glaucoma: Secondary | ICD-10-CM | POA: Insufficient documentation

## 2016-04-30 DIAGNOSIS — C7951 Secondary malignant neoplasm of bone: Secondary | ICD-10-CM

## 2016-04-30 DIAGNOSIS — R109 Unspecified abdominal pain: Secondary | ICD-10-CM | POA: Diagnosis not present

## 2016-04-30 DIAGNOSIS — F411 Generalized anxiety disorder: Secondary | ICD-10-CM

## 2016-04-30 DIAGNOSIS — Z79899 Other long term (current) drug therapy: Secondary | ICD-10-CM | POA: Insufficient documentation

## 2016-04-30 DIAGNOSIS — E440 Moderate protein-calorie malnutrition: Secondary | ICD-10-CM

## 2016-04-30 DIAGNOSIS — E860 Dehydration: Secondary | ICD-10-CM

## 2016-04-30 DIAGNOSIS — I714 Abdominal aortic aneurysm, without rupture: Secondary | ICD-10-CM | POA: Diagnosis not present

## 2016-04-30 DIAGNOSIS — R634 Abnormal weight loss: Secondary | ICD-10-CM

## 2016-04-30 LAB — COMPREHENSIVE METABOLIC PANEL
ALBUMIN: 3.8 g/dL (ref 3.5–5.0)
ALK PHOS: 159 U/L — AB (ref 38–126)
ALT: 88 U/L — ABNORMAL HIGH (ref 17–63)
ANION GAP: 7 (ref 5–15)
AST: 56 U/L — AB (ref 15–41)
BUN: 36 mg/dL — AB (ref 6–20)
CO2: 26 mmol/L (ref 22–32)
Calcium: 8.5 mg/dL — ABNORMAL LOW (ref 8.9–10.3)
Chloride: 95 mmol/L — ABNORMAL LOW (ref 101–111)
Creatinine, Ser: 1.46 mg/dL — ABNORMAL HIGH (ref 0.61–1.24)
GFR calc Af Amer: 55 mL/min — ABNORMAL LOW (ref >60)
GFR calc non Af Amer: 47 mL/min — ABNORMAL LOW (ref >60)
GLUCOSE: 99 mg/dL (ref 65–99)
POTASSIUM: 3.5 mmol/L (ref 3.5–5.1)
SODIUM: 128 mmol/L — AB (ref 135–145)
Total Bilirubin: 1.1 mg/dL (ref 0.3–1.2)
Total Protein: 6.6 g/dL (ref 6.5–8.1)

## 2016-04-30 LAB — CBC WITH DIFFERENTIAL/PLATELET
BASOS ABS: 0 10*3/uL (ref 0.0–0.1)
BASOS PCT: 0 %
EOS ABS: 0.3 10*3/uL (ref 0.0–0.7)
Eosinophils Relative: 3 %
HCT: 32.5 % — ABNORMAL LOW (ref 39.0–52.0)
HEMOGLOBIN: 11.3 g/dL — AB (ref 13.0–17.0)
Lymphocytes Relative: 13 %
Lymphs Abs: 1.2 10*3/uL (ref 0.7–4.0)
MCH: 30.5 pg (ref 26.0–34.0)
MCHC: 34.8 g/dL (ref 30.0–36.0)
MCV: 87.8 fL (ref 78.0–100.0)
MONOS PCT: 12 %
Monocytes Absolute: 1.1 10*3/uL — ABNORMAL HIGH (ref 0.1–1.0)
NEUTROS ABS: 6.8 10*3/uL (ref 1.7–7.7)
NEUTROS PCT: 72 %
Platelets: 267 10*3/uL (ref 150–400)
RBC: 3.7 MIL/uL — ABNORMAL LOW (ref 4.22–5.81)
RDW: 14.4 % (ref 11.5–15.5)
WBC: 9.3 10*3/uL (ref 4.0–10.5)

## 2016-04-30 MED ORDER — ESCITALOPRAM OXALATE 10 MG PO TABS: 10.0000 mg | ORAL_TABLET | Freq: Every day | ORAL | 1 refills | Status: AC

## 2016-04-30 MED ORDER — PANCRELIPASE (LIP-PROT-AMYL) 24000-76000 UNITS PO CPEP: ORAL_CAPSULE | ORAL | 1 refills | Status: AC

## 2016-04-30 MED ORDER — ONDANSETRON 8 MG PO TBDP: 8.0000 mg | ORAL_TABLET | Freq: Three times a day (TID) | ORAL | 1 refills | Status: DC | PRN

## 2016-04-30 MED ORDER — HEPARIN SOD (PORK) LOCK FLUSH 100 UNIT/ML IV SOLN
500.0000 [IU] | Freq: Once | INTRAVENOUS | Status: AC
  Administered 2016-04-30: 500 [IU] via INTRAVENOUS
  Filled 2016-04-30: qty 5

## 2016-04-30 MED ORDER — SODIUM CHLORIDE 0.9 % IV SOLN
Freq: Once | INTRAVENOUS | Status: AC
  Administered 2016-04-30: 11:00:00 via INTRAVENOUS

## 2016-04-30 MED ORDER — MEGESTROL ACETATE 400 MG/10ML PO SUSP: 400.0000 mg | Freq: Every day | ORAL | 1 refills | Status: AC

## 2016-04-30 NOTE — Patient Instructions (Signed)
[place] at [facility] Discharge Instructions  RECOMMENDATIONS MADE BY THE CONSULTANT AND ANY TEST RESULTS WILL BE SENT TO YOUR REFERRING PHYSICIAN.  Exam done and seen today by Dr. Whitney Muse Labs done today. Will start on Lexapro, megace and zofran when it is approved. Return to see the doctor as scheduled. Treatment Friday if labs are good. Please call the clinic if you have any questions or concerns  Thank you for choosing [place] at [facility] to provide your oncology and hematology care.  To afford each patient quality time with our provider, please arrive at least 15 minutes before your scheduled appointment time.   Beginning January 23rd 2017 lab work for the [facility] will be done in the  Main lab at [facility] on 1st floor. If you have a lab appointment with the [place] [facility] please come in thru the  Main Entrance and check in at the main information desk  You need to re-schedule your appointment should you arrive 10 or more minutes late.  We strive to give you quality time with our providers, and arriving late affects you and other patients whose appointments are after yours.  Also, if you no show three or more times for appointments you may be dismissed from the clinic at the providers discretion.     Again, thank you for choosing [facility].  Our hope is that these requests will decrease the amount of time that you wait before being seen by our physicians.       _____________________________________________________________  Should you have questions after your visit to [facility], please contact our office at [phone] between the hours of 8:30 a.m. and 4:30 p.m.  Voicemails left after 4:30 p.m. will not be returned until the following business day.  For prescription refill requests, have your pharmacy contact our office.         Resources For Cancer Patients and their  Caregivers ? American Cancer Society: Can assist with transportation, wigs, general needs, runs Look Good Feel Better.        321-717-3285 ? Cancer Care: Provides financial assistance, online support groups, medication/co-pay assistance.  1-800-813-HOPE (270) 807-8808) ? [facility] Assists [place] cancer patients and their families through emotional , educational and financial support.  229-135-7731 ? [place] DSS Where to apply for food stamps, Medicaid and utility assistance. 364-703-5003 ? RCATS: Transportation to medical appointments. (873)753-0144 ? Social Security Administration: May apply for disability if have a Stage IV cancer. 6622699396 (951) [phone] ? [employer], Disability and Transit Services: Assists with nutrition, care and transit needs. [place] Support Programs: @10RELATIVEDAYS @ > Cancer Support Group  2nd Tuesday of the month 1pm-2pm, Journey Room  > Creative Journey  3rd Tuesday of the month 1130am-1pm, Journey Room  > Look Good Feel Better  1st Wednesday of the month 10am-12 noon, Journey Room (Call [place] to register [phone])

## 2016-04-30 NOTE — Patient Instructions (Signed)
[place] at [facility] Discharge Instructions  RECOMMENDATIONS MADE BY THE CONSULTANT AND ANY TEST RESULTS WILL BE SENT TO YOUR REFERRING PHYSICIAN.  Hydration received for 2 hrs today. Follow-up as scheduled. Call clinic for any questions or concerns  Thank you for choosing [place] at [facility] to provide your oncology and hematology care.  To afford each patient quality time with our provider, please arrive at least 15 minutes before your scheduled appointment time.   Beginning January 23rd 2017 lab work for the [facility] will be done in the  Main lab at [facility] on 1st floor. If you have a lab appointment with the [place] please come in thru the  Main Entrance and check in at the main information desk  You need to re-schedule your appointment should you arrive 10 or more minutes late.  We strive to give you quality time with our providers, and arriving late affects you and other patients whose appointments are after yours.  Also, if you no show three or more times for appointments you may be dismissed from the clinic at the providers discretion.     Again, thank you for choosing [facility].  Our hope is that these requests will decrease the amount of time that you wait before being seen by our physicians.       _____________________________________________________________  Should you have questions after your visit to [facility], please contact our office at [phone] between the hours of 8:30 a.m. and 4:30 p.m.  Voicemails left after 4:30 p.m. will not be returned until the following business day.  For prescription refill requests, have your pharmacy contact our office.         Resources For Cancer Patients and their Caregivers ? American Cancer Society: Can assist with transportation, wigs, general needs, runs Look Good Feel Better.        562-292-8194 ? Cancer Care: Provides  financial assistance, online support groups, medication/co-pay assistance.  1-800-813-HOPE 479 527 1218) ? [place] Assists [place] cancer patients and their families through emotional , educational and financial support.  2206264908 ? [place] DSS Where to apply for food stamps, Medicaid and utility assistance. 9413715673 ? RCATS: Transportation to medical appointments. 561-423-7854 ? Social Security Administration: May apply for disability if have a Stage IV cancer. (765) 376-0074 380-245-6088 ? [employer], Disability and Transit Services: Assists with nutrition, care and transit needs. [place] Support Programs: @10RELATIVEDAYS @ > Cancer Support Group  2nd Tuesday of the month 1pm-2pm, Journey Room  > Creative Journey  3rd Tuesday of the month 1130am-1pm, Journey Room  > Look Good Feel Better  1st Wednesday of the month 10am-12 noon, Journey Room (Call [place] to register [phone])

## 2016-04-30 NOTE — Progress Notes (Signed)
Lorie Phenix tolerated hydration well without complaints. Pt discharged self ambulatory in satisfactory condition with wife

## 2016-04-30 NOTE — Progress Notes (Signed)
Meet with pt, introduced myself.  Gave them some sample of boost clears.  Told them about carnation instant breakfast.  After lab work was complete, set pt up for IV fluids today in the infusion area.  Called prescriptions in for pt.  Went over lab work with family.

## 2016-04-30 NOTE — Progress Notes (Signed)
[place]  CONSULT NOTE  Patient Care Team: No Pcp Per Patient as PCP - General (General Practice)  CHIEF COMPLAINTS/PURPOSE OF CONSULTATION:  Stage IV adenocarcinoma of pancreas Fatigue Weight loss Abdominal pain    Pancreatic cancer metastasized to liver ([place])   03/29/2016 Tumor Marker    CA [id number]      03/31/2016 Imaging    CT chest RL and RUL pulmonary nodules suspicious, prominent subcarinal node, scattered small sclerotic foci within T11 and L clavicle also seen within the abdominal pelvic osseous structures      03/31/2016 Initial Biopsy    US biopsy of R liver lobe mass      03/31/2016 Pathology Results    metastatic adenocarcinoma CK7 and CK20 positive c/w pancreaticobiliary origin      04/11/2016 -  Chemotherapy    The patient had palonosetron (ALOXI) injection 0.25 mg, 0.25 mg, Intravenous,  Once, 1 of 5 cycles  PACLitaxel-protein bound (ABRAXANE) chemo infusion 250 mg, 125 mg/m2 = 250 mg, Intravenous,  Once, 1 of 5 cycles  gemcitabine (GEMZAR) 2,014 mg in sodium chloride 0.9 % 250 mL chemo infusion, 1,000 mg/m2 = 2,014 mg, Intravenous,  Once, 1 of 5 cycles Dose modification: 750 mg/m2 (original dose 1,000 mg/m2, Cycle 1, Reason: Dose not tolerated)  for chemotherapy treatment.         HISTORY OF PRESENTING ILLNESS:  Ricky Gay 70 y.o. male is here because of stage IV adenocarcinoma of the pancreas.   Ricky Gay is accompanied by his wife and daughter-in-law. I personally reviewed and went over laboratory studies with the patient.  He takes his nausea pill then waits a few minutes before taking his pain medication. Afterwards he will take his Xanax. Last night before bed, he took his anti-nausea and pain medication and was vomiting before he could take his Xanax. He does not believe his pain medication is causing his nausea. States the dry heaving is worse than the actual vomiting and he experiences tinnitus while dry heaving.   He felt  better this past week after not having taken chemotherapy last week. He has felt more like doing something.   He also reports he has been pacing a lot. He took half a Xanax to see if this would help stop his pacing but not make him fall asleep, but it did not help. He thinks about the things he can do and things he can't do. He has become sick of watching television, instead he'll walk outside or play with the dog. Reports his energy level is so low. He feels really weak and admits it is because he is not eating well.  His wife believes he must walk up to 5 miles daily between walking inside to outside and pacing about the house. He did not have anxiety issues prior to his diagnosis. However she notes he was never home but always on the go.   His daughter-in-law brought him protein cakes which he has been able to eat because it is soy-based without dairy. They have noticed that dairy products make him more likely to vomit. They have eliminated dairy from his diet. He has also been eating dairy-free ice cream. He is not sure if he will be able to keep down Boost or Ensure.  States he is able to eat peanut butter crackers and they go down okay.  He reports short term memory loss. He'll sometimes forget to take his pain medication after taking his anti-nausea pill, then he  becomes frustrated.  His daughter-in-law notes his pain is well managed with current pain medication. Ricky Gay remarks it is managed if he is able to keep the pills down. He believes the pain medicine is doing what it is supposed to as long as he can keep it down.  He has a fentanyl patch but does not want to increase it.   His prior pain medication induced constipation has been eliminated with Linzess. His wife remarks "This was a god send".   He is not sleeping well.  He presented to the ED on 04/21/16 for dehydration secondary to vomiting. He continues to need ongoing fluids here in the clinic. He generally tolerates  chemotherapy poorly.   MEDICAL HISTORY:  Past Medical History:  Diagnosis Date  . AAA (abdominal aortic aneurysm) ([place])   . Chronic constipation   . Glaucoma   . Hypertension   . Malnutrition ([place])   . Pancreatic cancer [facility])     SURGICAL HISTORY: Past Surgical History:  Procedure Laterality Date  . EYE SURGERY      SOCIAL HISTORY: Social History   Social History  . Marital status: Married    Spouse name: N/A  . Number of children: N/A  . Years of education: N/A   Occupational History  . Not on file.   Social History Main Topics  . Smoking status: Former Research scientist (life sciences)  . Smokeless tobacco: Never Used  . Alcohol use Yes     Comment: once a week  . Drug use: No  . Sexual activity: Not on file   Other Topics Concern  . Not on file   Social History Narrative  . No narrative on file   Married 30+ years She was a Radio producer; he was in Press photographer. 1 son, only son. 1 grandson.  FAMILY HISTORY: Family History  Problem Relation Age of Onset  . Dementia Mother   . CAD Father   . Stroke Father   . Cancer Other   . Diabetes Neg Hx     ALLERGIES:  is allergic to bactrim [sulfamethoxazole-trimethoprim].  MEDICATIONS:  Current Outpatient Prescriptions  Medication Sig Dispense Refill  . ALPRAZolam (XANAX) 0.25 MG tablet Take 1 to 2 tablets every 8 hours as needed for anxiety 45 tablet 1  . atorvastatin (LIPITOR) 10 MG tablet Take 10 mg by mouth daily.  1  . dorzolamide (TRUSOPT) 2 % ophthalmic solution Place 1 drop into both eyes 3 (three) times daily. Days 1, 8, 15 every 28 days    . fentaNYL (DURAGESIC - DOSED MCG/HR) 50 MCG/HR Place 1 patch (50 mcg total) onto the skin every 3 (three) days. 10 patch 0  . Gemcitabine HCl (GEMZAR IV) Inject into the vein.    Marland Kitchen HYDROmorphone (DILAUDID) 4 MG tablet Take 1 tablet (4 mg total) by mouth every 4 (four) hours as needed for severe pain. 90 tablet 0  . hydrOXYzine (ATARAX/VISTARIL) 25 MG tablet Take 1 tablet (25 mg total) by  mouth every 6 (six) hours as needed for itching. 60 tablet 0  . lidocaine-prilocaine (EMLA) cream Apply a quarter size amount to port site 1 hour prior to chemo. Do not rub in. Cover with plastic wrap. 30 g 3  . linaclotide (LINZESS) 145 MCG CAPS capsule Take 1 capsule (145 mcg total) by mouth daily before breakfast. 30 capsule 3  . lisinopril-hydrochlorothiazide (PRINZIDE,ZESTORETIC) 20-12.5 MG tablet Take 1 tablet by mouth daily.  1  . ondansetron (ZOFRAN) 4 MG/5ML solution Take 5 mLs (4 mg total) by  mouth every 8 (eight) hours as needed for nausea or vomiting. 50 mL 2  . ondansetron (ZOFRAN-ODT) 4 MG disintegrating tablet TAKE ONE TABLET BY MOUTH EVERY 4 HOURS AS NEEDED FOR NAUSEA/VOMITTING 12 tablet 0  . prochlorperazine (COMPAZINE) 10 MG tablet Take 1 tablet (10 mg total) by mouth every 6 (six) hours as needed for nausea or vomiting. 30 tablet 2  . escitalopram (LEXAPRO) 10 MG tablet Take 1 tablet (10 mg total) by mouth daily. 30 tablet 1  . escitalopram (LEXAPRO) 20 MG tablet Take 1 tablet (20 mg total) by mouth daily. 30 tablet 1  . fluconazole (DIFLUCAN) 100 MG tablet Take 200 mg today then 100 mg for 15 days 17 tablet 0  . magic mouthwash SOLN Take 5 mLs by mouth 4 (four) times daily as needed for mouth pain. 240 mL 1  . megestrol (MEGACE) 400 MG/10ML suspension Take 10 mLs (400 mg total) by mouth daily. 240 mL 1  . ondansetron (ZOFRAN ODT) 8 MG disintegrating tablet Take 1 tablet (8 mg total) by mouth every 8 (eight) hours as needed for nausea or vomiting. 30 tablet 1  . PACLitaxel Protein-Bound Part (ABRAXANE IV) Inject into the vein. Days 1, 8, 15 every 28 days    . Pancrelipase, Lip-Prot-Amyl, (CREON) 24000 units CPEP Take with meals daily. 180 capsule 1  . POT BICARB-POT CHLORIDE,25MEQ, 25 MEQ TBEF Take 0.8 tablets (20 mEq total) by mouth 2 (two) times daily. 60 tablet 2  . zolpidem (AMBIEN) 5 MG tablet Take 1 tablet (5 mg total) by mouth at bedtime. 30 tablet 0   No current  facility-administered medications for this visit.    Facility-Administered Medications Ordered in Other Visits  Medication Dose Route Frequency Provider Last Rate Last Dose  . 0.9 %  sodium chloride infusion   Intravenous Continuous Baird Cancer, PA-C      . hydrOXYzine (ATARAX/VISTARIL) tablet 25 mg  25 mg Oral Q6H PRN [name], MD        Review of Systems  Constitutional: Positive for malaise/fatigue and weight loss. Negative for chills, diaphoresis and fever.       Weakness associated with low food intake and frequent vomiting. Weight loss of 7 lbs since 04/25/16.  HENT: Positive for tinnitus. Negative for congestion, ear discharge, ear pain, hearing loss, nosebleeds and sore throat.        Tinnitus while dry heaving  Eyes: Negative.  Negative for blurred vision, double vision, photophobia, pain, discharge and redness.  Respiratory: Negative.  Negative for cough, hemoptysis, sputum production, shortness of breath, wheezing and stridor.   Cardiovascular: Negative.  Negative for chest pain, palpitations, orthopnea, claudication, leg swelling and PND.  Gastrointestinal: Positive for nausea and vomiting. Negative for blood in stool, constipation, diarrhea, heartburn and melena.       Frequent vomiting associated with disease and dairy-products. Prior constipation alleviated by Linzess  Genitourinary: Negative.  Negative for dysuria, flank pain, frequency, hematuria and urgency.  Musculoskeletal: Negative.  Negative for back pain, falls, joint pain, myalgias and neck pain.  Skin: Negative.  Negative for itching and rash.  Neurological: Positive for weakness. Negative for dizziness, tingling, tremors, sensory change, speech change, focal weakness, seizures, loss of consciousness and headaches.  Endo/Heme/Allergies: Negative for environmental allergies and polydipsia. Does not bruise/bleed easily.  Psychiatric/Behavioral: Negative for depression, hallucinations, memory loss,  substance abuse and suicidal ideas. The patient is nervous/anxious. The patient does not have insomnia.        Anxious about diagnosis.  Paces a lot.  All other systems reviewed and are negative.  14 point ROS was done and is otherwise as detailed above or in HPI    PHYSICAL EXAMINATION: ECOG PERFORMANCE STATUS: 2 - Symptomatic, <50% confined to bed  Vitals:   04/30/16 0925  BP: (!) 105/54  Pulse: (!) 56  Resp: 16  Temp: 97.7 F (36.5 C)   Filed Weights   04/30/16 0925  Weight: 171 lb 4.8 oz (77.7 kg)   Physical Exam  Constitutional: He is oriented to person, place, and time. He appears distressed.  Thin, pacing during our visit. Anxious  HENT:  Head: Normocephalic and atraumatic.  Nose: Nose normal.  Mouth/Throat: Oropharynx is clear and moist. No oropharyngeal exudate.  Eyes: Conjunctivae and EOM are normal. Pupils are equal, round, and reactive to light. Right eye exhibits no discharge. Left eye exhibits no discharge. No scleral icterus.  Neck: Normal range of motion. Neck supple. No tracheal deviation present. No thyromegaly present.  Cardiovascular: Normal rate, regular rhythm, normal heart sounds and intact distal pulses.  Exam reveals no gallop and no friction rub.   No murmur heard. Pulmonary/Chest: Effort normal and breath sounds normal. He has no wheezes. He has no rales.  Abdominal: Soft. Bowel sounds are normal. He exhibits no distension and no mass. There is tenderness. There is no rebound and no guarding.  Pain in the upper abdominal quadrant with palpation. Hypoactive BS throughout. No guarding  Musculoskeletal: Normal range of motion. He exhibits no edema.  Lymphadenopathy:    He has no cervical adenopathy.  Neurological: He is alert and oriented to person, place, and time. He has normal reflexes. No cranial nerve deficit. Gait normal. Coordination normal.  Skin: Skin is warm and dry. No rash noted.  Psychiatric: Memory, affect and judgment normal.  Nursing  note and vitals reviewed.   LABORATORY DATA:  I have reviewed the data listed below: Results for Ricky, Gay (MRN [id number])   Ref. Range 04/30/2016 10:12  Sodium Latest Ref Range: 135 - 145 mmol/L 128 (L)  Potassium Latest Ref Range: 3.5 - 5.1 mmol/L 3.5  Chloride Latest Ref Range: 101 - 111 mmol/L 95 (L)  CO2 Latest Ref Range: 22 - 32 mmol/L 26  BUN Latest Ref Range: 6 - 20 mg/dL 36 (H)  Creatinine Latest Ref Range: 0.61 - 1.24 mg/dL 1.46 (H)  Calcium Latest Ref Range: 8.9 - 10.3 mg/dL 8.5 (L)  EGFR (Non-African Amer.) Latest Ref Range: >60 mL/min 47 (L)  EGFR (African American) Latest Ref Range: >60 mL/min 55 (L)  Glucose Latest Ref Range: 65 - 99 mg/dL 99  Anion gap Latest Ref Range: 5 - 15  7  Alkaline Phosphatase Latest Ref Range: 38 - 126 U/L 159 (H)  Albumin Latest Ref Range: 3.5 - 5.0 g/dL 3.8  AST Latest Ref Range: 15 - 41 U/L 56 (H)  ALT Latest Ref Range: 17 - 63 U/L 88 (H)  Total Protein Latest Ref Range: 6.5 - 8.1 g/dL 6.6  Total Bilirubin Latest Ref Range: 0.3 - 1.2 mg/dL 1.1  WBC Latest Ref Range: 4.0 - 10.5 K/uL 9.3  RBC Latest Ref Range: 4.22 - 5.81 MIL/uL 3.70 (L)  Hemoglobin Latest Ref Range: 13.0 - 17.0 g/dL 11.3 (L)  HCT Latest Ref Range: 39.0 - 52.0 % 32.5 (L)  MCV Latest Ref Range: 78.0 - 100.0 fL 87.8  MCH Latest Ref Range: 26.0 - 34.0 pg 30.5  MCHC Latest Ref Range: 30.0 - 36.0 g/dL 34.8  RDW Latest  Ref Range: 11.5 - 15.5 % 14.4  Platelets Latest Ref Range: 150 - 400 K/uL 267  Neutrophils Latest Units: % 72  Lymphocytes Latest Units: % 13  Monocytes Relative Latest Units: % 12  Eosinophil Latest Units: % 3  Basophil Latest Units: % 0  NEUT# Latest Ref Range: 1.7 - 7.7 K/uL 6.8  Lymphocyte # Latest Ref Range: 0.7 - 4.0 K/uL 1.2  Monocyte # Latest Ref Range: 0.1 - 1.0 K/uL 1.1 (H)  Eosinophils Absolute Latest Ref Range: 0.0 - 0.7 K/uL 0.3  Basophils Absolute Latest Ref Range: 0.0 - 0.1 K/uL 0.0   Results for Ricky, Gay (MRN [id number])  Ref. Range  04/11/2016 09:05 04/18/2016 09:11 04/21/2016 09:12 04/25/2016 08:26 04/30/2016 10:12  Creatinine Latest Ref Range: 0.61 - 1.24 mg/dL 0.85 1.06 1.16 1.37 (H) 1.46 (H)   PATHOLOGY:       RADIOGRAPHIC STUDIES: I have personally reviewed the radiological images as listed and agreed with the findings in the report. No results found.  Study Result   CLINICAL DATA:  Constipation and generalized of abdominal pain for the past month; history of pancreatic malignancy metastatic to the liver.  EXAM: ABDOMEN - 1 VIEW  COMPARISON:  Abdominal and pelvic CT scan of March 28, 2016  FINDINGS: The colonic stool burden is moderate. There is increased stool in the rectum. There is gas within numerous normal caliber small bowel loops. The bony structures exhibit no acute abnormalities. No abnormal soft tissue calcifications are observed. There is a metallic clip projecting over the right iliac crest.  IMPRESSION: Mildly increase colonic stool burden may reflect clinical constipation. No evidence of obstruction.   Electronically Signed   By: David  Martinique M.D.   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  He continues to loose weight. We discussed trying small, frequent meals.  I have written him a prescription for Megace, pancreatic enzymes, and Lexapro. I will prior authorize him more Zofran.   Family is not unrealistic but I advised the patient and family today that if we cannot improve how he is doing over the next few weeks we may need to shift  to palliative options without chemotherapy. RTC 1 to 2 weeks. Continue with therapy as ordered.   Meds ordered this encounter  Medications  . escitalopram (LEXAPRO) 10 MG tablet    Sig: Take 1 tablet (10 mg total) by mouth daily.    Dispense:  30 tablet    Refill:  1  . megestrol (MEGACE) 400 MG/10ML suspension    Sig: Take 10 mLs (400 mg total) by mouth daily.    Dispense:  240 mL    Refill:  1  . DISCONTD: ondansetron (ZOFRAN ODT) 8 MG disintegrating tablet    Sig: Take 1 tablet (8 mg total) by mouth every 8 (eight) hours as needed for nausea or vomiting.    Dispense:  30 tablet    Refill:  1  . Pancrelipase, Lip-Prot-Amyl, (CREON) 24000 units CPEP    Sig: Take with meals daily.    Dispense:  180 capsule    Refill:  1    All questions were answered.  The patient knows to call the clinic with any problems, questions or concerns.  This document serves as a record of services personally performed by Ancil Linsey, MD. It was created on her behalf by Arlyce Harman, a trained medical scribe. The creation of this record is based on the scribe's personal observations and the provider's statements to them. This document has been checked and approved by the attending provider.  I have reviewed the above documentation for accuracy and completeness and I agree with the above.  This note was electronically signed.    Molli Hazard, MD  05/25/2016 7:15 PM

## 2016-05-02 ENCOUNTER — Encounter (HOSPITAL_BASED_OUTPATIENT_CLINIC_OR_DEPARTMENT_OTHER): Payer: Medicare HMO

## 2016-05-02 VITALS — BP 141/72 | HR 55 | Temp 97.6°F | Resp 16 | Wt 170.8 lb

## 2016-05-02 DIAGNOSIS — C787 Secondary malignant neoplasm of liver and intrahepatic bile duct: Secondary | ICD-10-CM | POA: Diagnosis not present

## 2016-05-02 DIAGNOSIS — Z5111 Encounter for antineoplastic chemotherapy: Secondary | ICD-10-CM

## 2016-05-02 DIAGNOSIS — C259 Malignant neoplasm of pancreas, unspecified: Secondary | ICD-10-CM | POA: Diagnosis not present

## 2016-05-02 LAB — CBC WITH DIFFERENTIAL/PLATELET
Basophils Absolute: 0 10*3/uL (ref 0.0–0.1)
Basophils Relative: 0 %
Eosinophils Absolute: 0.1 10*3/uL (ref 0.0–0.7)
Eosinophils Relative: 1 %
HEMATOCRIT: 29.7 % — AB (ref 39.0–52.0)
HEMOGLOBIN: 10.3 g/dL — AB (ref 13.0–17.0)
LYMPHS ABS: 1.1 10*3/uL (ref 0.7–4.0)
LYMPHS PCT: 13 %
MCH: 30.7 pg (ref 26.0–34.0)
MCHC: 34.7 g/dL (ref 30.0–36.0)
MCV: 88.4 fL (ref 78.0–100.0)
Monocytes Absolute: 0.9 10*3/uL (ref 0.1–1.0)
Monocytes Relative: 11 %
NEUTROS ABS: 6 10*3/uL (ref 1.7–7.7)
Neutrophils Relative %: 75 %
Platelets: 395 10*3/uL (ref 150–400)
RBC: 3.36 MIL/uL — AB (ref 4.22–5.81)
RDW: 14.6 % (ref 11.5–15.5)
WBC: 8.1 10*3/uL (ref 4.0–10.5)

## 2016-05-02 LAB — COMPREHENSIVE METABOLIC PANEL
ALBUMIN: 3.5 g/dL (ref 3.5–5.0)
ALT: 65 U/L — ABNORMAL HIGH (ref 17–63)
ANION GAP: 5 (ref 5–15)
AST: 48 U/L — AB (ref 15–41)
Alkaline Phosphatase: 144 U/L — ABNORMAL HIGH (ref 38–126)
BILIRUBIN TOTAL: 0.8 mg/dL (ref 0.3–1.2)
BUN: 29 mg/dL — ABNORMAL HIGH (ref 6–20)
CHLORIDE: 97 mmol/L — AB (ref 101–111)
CO2: 25 mmol/L (ref 22–32)
Calcium: 8.1 mg/dL — ABNORMAL LOW (ref 8.9–10.3)
Creatinine, Ser: 1.19 mg/dL (ref 0.61–1.24)
GFR calc Af Amer: >60 mL/min (ref >60)
GFR calc non Af Amer: >60 mL/min (ref >60)
GLUCOSE: 117 mg/dL — AB (ref 65–99)
POTASSIUM: 3.9 mmol/L (ref 3.5–5.1)
Sodium: 127 mmol/L — ABNORMAL LOW (ref 135–145)
TOTAL PROTEIN: 6.1 g/dL — AB (ref 6.5–8.1)

## 2016-05-02 MED ORDER — SODIUM CHLORIDE 0.9 % IV SOLN
750.0000 mg/m2 | Freq: Once | INTRAVENOUS | Status: AC
  Administered 2016-05-02: 1520 mg via INTRAVENOUS
  Filled 2016-05-02: qty 39.98

## 2016-05-02 MED ORDER — HEPARIN SOD (PORK) LOCK FLUSH 100 UNIT/ML IV SOLN
500.0000 [IU] | Freq: Once | INTRAVENOUS | Status: AC | PRN
  Administered 2016-05-02: 500 [IU]

## 2016-05-02 MED ORDER — PACLITAXEL PROTEIN-BOUND CHEMO INJECTION 100 MG
125.0000 mg/m2 | Freq: Once | INTRAVENOUS | Status: AC
  Administered 2016-05-02: 250 mg via INTRAVENOUS
  Filled 2016-05-02: qty 50

## 2016-05-02 MED ORDER — HEPARIN SOD (PORK) LOCK FLUSH 100 UNIT/ML IV SOLN
INTRAVENOUS | Status: AC
  Filled 2016-05-02: qty 5

## 2016-05-02 MED ORDER — SODIUM CHLORIDE 0.9% FLUSH: 10.0000 mL | INTRAVENOUS | Status: DC | PRN

## 2016-05-02 MED ORDER — SODIUM CHLORIDE 0.9 % IV SOLN
Freq: Once | INTRAVENOUS | Status: AC
  Administered 2016-05-02: 11:00:00 via INTRAVENOUS

## 2016-05-02 MED ORDER — LORAZEPAM 2 MG/ML IJ SOLN
0.5000 mg | Freq: Once | INTRAMUSCULAR | Status: AC
  Administered 2016-05-02: 0.5 mg via INTRAVENOUS
  Filled 2016-05-02: qty 1

## 2016-05-02 MED ORDER — PALONOSETRON HCL INJECTION 0.25 MG/5ML
0.2500 mg | Freq: Once | INTRAVENOUS | Status: AC
Start: 2016-05-02 — End: 2016-05-02
  Administered 2016-05-02: 0.25 mg via INTRAVENOUS
  Filled 2016-05-02: qty 5

## 2016-05-02 NOTE — Patient Instructions (Signed)
[facility] Discharge Instructions for Patients Receiving Chemotherapy   Beginning January 23rd 2017 lab work for the [facility] will be done in the  Main lab at [facility] on 1st floor. If you have a lab appointment with the [facility] please come in thru the  Main Entrance and check in at the main information desk   Today you received the following chemotherapy agents Abraxane and Gemzar. Follow-up as scheduled. Call clinic for any questions and concerns  To help prevent nausea and vomiting after your treatment, we encourage you to take your nausea medication   If you develop nausea and vomiting, or diarrhea that is not controlled by your medication, call the clinic.  The clinic phone number is [phone]. Office hours are Monday-Friday 8:30am-5:00pm.  BELOW ARE SYMPTOMS THAT SHOULD BE REPORTED IMMEDIATELY:  *FEVER GREATER THAN 101.0 F  *CHILLS WITH OR WITHOUT FEVER  NAUSEA AND VOMITING THAT IS NOT CONTROLLED WITH YOUR NAUSEA MEDICATION  *UNUSUAL SHORTNESS OF BREATH  *UNUSUAL BRUISING OR BLEEDING  TENDERNESS IN MOUTH AND THROAT WITH OR WITHOUT PRESENCE OF ULCERS  *URINARY PROBLEMS  *BOWEL PROBLEMS  UNUSUAL RASH Items with * indicate a potential emergency and should be followed up as soon as possible. If you have an emergency after office hours please contact your primary care physician or go to the nearest emergency department.  Please call the clinic during office hours if you have any questions or concerns.   You may also contact the Patient Navigator at [phone] should you have any questions or need assistance in obtaining follow up care.      Resources For Cancer Patients and their Caregivers ? American Cancer Society: Can assist with transportation, wigs, general needs, runs Look Good Feel Better.        (512) 565-6686 ? Cancer Care: Provides financial assistance, online support groups, medication/co-pay assistance.   1-800-813-HOPE (671) 844-5780) ? [place] Assists [place] cancer patients and their families through emotional , educational and financial support.  301-166-4132 ? [place] DSS Where to apply for food stamps, Medicaid and utility assistance. 479-825-9159 ? RCATS: Transportation to medical appointments. 580-256-0751 ? Social Security Administration: May apply for disability if have a Stage IV cancer. 416-828-1865 331-822-6411 ? [employer], Disability and Transit Services: Assists with nutrition, care and transit needs. 704-613-1261

## 2016-05-02 NOTE — Progress Notes (Signed)
Ricky Gay tolerated chemo tx well without complaints.Lab results shown to MD prior to chemo tx. Pt discharged via wheelchair due to unsteadiness from Ativan given as a pre med prior to chemo. Pt discharged in satisfactory condition with wife

## 2016-05-05 ENCOUNTER — Encounter (HOSPITAL_COMMUNITY): Payer: Self-pay | Admitting: Emergency Medicine

## 2016-05-05 LAB — CANCER ANTIGEN 19-9: CA 19 9: 40182 U/mL — AB (ref 0–35)

## 2016-05-05 NOTE — Progress Notes (Unsigned)
Pt called and stated that he has been on his linzess for 3 days without a bowel movement.  And he is having lots of gas.  Spoke with Kirby Crigler PA he told me to add Mirlax to his regimen.

## 2016-05-06 ENCOUNTER — Ambulatory Visit: Payer: Self-pay | Admitting: Gastroenterology

## 2016-05-07 ENCOUNTER — Encounter (HOSPITAL_COMMUNITY): Payer: Self-pay | Admitting: Emergency Medicine

## 2016-05-07 NOTE — Progress Notes (Unsigned)
Pt called and stated that he had thrown up last night and that he was having diarrhea now after taking the miralax. Pt is not going to take the miralax today.  Pt is taking zofran every 8 hours.  I told him to add compazine every 6 hours to see if that helps if it does not to call me and I would get him something else for nausea.

## 2016-05-09 ENCOUNTER — Ambulatory Visit (HOSPITAL_COMMUNITY): Payer: Medicare HMO

## 2016-05-09 ENCOUNTER — Other Ambulatory Visit (HOSPITAL_COMMUNITY): Payer: Self-pay | Admitting: *Deleted

## 2016-05-09 ENCOUNTER — Ambulatory Visit (HOSPITAL_COMMUNITY): Payer: Medicare HMO | Admitting: Hematology & Oncology

## 2016-05-09 ENCOUNTER — Encounter (HOSPITAL_COMMUNITY)
Admission: RE | Admit: 2016-05-09 | Discharge: 2016-05-09 | Disposition: A | Discharge status: Home / Self Care | Payer: Medicare HMO | Source: Ambulatory Visit | Attending: Hematology & Oncology | Admitting: Hematology & Oncology

## 2016-05-09 ENCOUNTER — Encounter (HOSPITAL_COMMUNITY): Payer: Medicare HMO

## 2016-05-09 DIAGNOSIS — C259 Malignant neoplasm of pancreas, unspecified: Secondary | ICD-10-CM | POA: Diagnosis not present

## 2016-05-09 DIAGNOSIS — C787 Secondary malignant neoplasm of liver and intrahepatic bile duct: Principal | ICD-10-CM

## 2016-05-09 DIAGNOSIS — E860 Dehydration: Secondary | ICD-10-CM | POA: Insufficient documentation

## 2016-05-09 LAB — COMPREHENSIVE METABOLIC PANEL
ALBUMIN: 3.5 g/dL (ref 3.5–5.0)
ALK PHOS: 134 U/L — AB (ref 38–126)
ALT: 81 U/L — AB (ref 17–63)
AST: 59 U/L — AB (ref 15–41)
Anion gap: 9 (ref 5–15)
BILIRUBIN TOTAL: 1.2 mg/dL (ref 0.3–1.2)
BUN: 47 mg/dL — ABNORMAL HIGH (ref 6–20)
CHLORIDE: 94 mmol/L — AB (ref 101–111)
CO2: 24 mmol/L (ref 22–32)
CREATININE: 2.06 mg/dL — AB (ref 0.61–1.24)
Calcium: 8.2 mg/dL — ABNORMAL LOW (ref 8.9–10.3)
GFR calc non Af Amer: 31 mL/min — ABNORMAL LOW (ref >60)
GFR, EST AFRICAN AMERICAN: 36 mL/min — AB (ref >60)
Glucose, Bld: 105 mg/dL — ABNORMAL HIGH (ref 65–99)
Potassium: 3.9 mmol/L (ref 3.5–5.1)
Sodium: 127 mmol/L — ABNORMAL LOW (ref 135–145)
Total Protein: 6.3 g/dL — ABNORMAL LOW (ref 6.5–8.1)

## 2016-05-09 LAB — CBC WITH DIFFERENTIAL/PLATELET
BASOS ABS: 0.1 10*3/uL (ref 0.0–0.1)
Basophils Relative: 1 %
Eosinophils Absolute: 0.1 10*3/uL (ref 0.0–0.7)
Eosinophils Relative: 1 %
HEMATOCRIT: 29.8 % — AB (ref 39.0–52.0)
HEMOGLOBIN: 10.5 g/dL — AB (ref 13.0–17.0)
LYMPHS PCT: 11 %
Lymphs Abs: 1 10*3/uL (ref 0.7–4.0)
MCH: 31 pg (ref 26.0–34.0)
MCHC: 35.2 g/dL (ref 30.0–36.0)
MCV: 87.9 fL (ref 78.0–100.0)
MONOS PCT: 6 %
Monocytes Absolute: 0.6 10*3/uL (ref 0.1–1.0)
NEUTROS ABS: 7.6 10*3/uL (ref 1.7–7.7)
Neutrophils Relative %: 81 %
Platelets: 276 10*3/uL (ref 150–400)
RBC: 3.39 MIL/uL — AB (ref 4.22–5.81)
RDW: 14.7 % (ref 11.5–15.5)
WBC Morphology: INCREASED
WBC: 9.4 10*3/uL (ref 4.0–10.5)

## 2016-05-09 LAB — SAMPLE TO BLOOD BANK

## 2016-05-09 MED ORDER — SODIUM CHLORIDE 0.9 % IV SOLN
INTRAVENOUS | Status: DC
  Administered 2016-05-09: 1000 mL via INTRAVENOUS

## 2016-05-09 MED ORDER — HEPARIN SOD (PORK) LOCK FLUSH 100 UNIT/ML IV SOLN
500.0000 [IU] | INTRAVENOUS | Status: AC | PRN
  Administered 2016-05-09: 500 [IU]

## 2016-05-09 MED ORDER — HEPARIN SOD (PORK) LOCK FLUSH 100 UNIT/ML IV SOLN
INTRAVENOUS | Status: AC
  Filled 2016-05-09: qty 5

## 2016-05-09 MED ORDER — SODIUM CHLORIDE 0.9 % IV SOLN: INTRAVENOUS | Status: AC

## 2016-05-12 ENCOUNTER — Encounter (HOSPITAL_BASED_OUTPATIENT_CLINIC_OR_DEPARTMENT_OTHER): Payer: Medicare HMO

## 2016-05-12 VITALS — BP 115/62 | HR 61 | Temp 98.0°F | Resp 18 | Wt 161.6 lb

## 2016-05-12 DIAGNOSIS — C259 Malignant neoplasm of pancreas, unspecified: Secondary | ICD-10-CM | POA: Diagnosis not present

## 2016-05-12 DIAGNOSIS — C787 Secondary malignant neoplasm of liver and intrahepatic bile duct: Secondary | ICD-10-CM | POA: Diagnosis not present

## 2016-05-12 MED ORDER — SODIUM CHLORIDE 0.9 % IV SOLN
INTRAVENOUS | Status: DC
  Administered 2016-05-12: 11:00:00 via INTRAVENOUS

## 2016-05-12 MED ORDER — ONDANSETRON 8 MG PO TBDP: 8.0000 mg | ORAL_TABLET | Freq: Three times a day (TID) | ORAL | 1 refills | Status: AC | PRN

## 2016-05-12 MED ORDER — SODIUM CHLORIDE 0.9% FLUSH
10.0000 mL | INTRAVENOUS | Status: DC | PRN
  Administered 2016-05-12: 10 mL via INTRAVENOUS
  Filled 2016-05-12: qty 10

## 2016-05-12 MED ORDER — HEPARIN SOD (PORK) LOCK FLUSH 100 UNIT/ML IV SOLN
500.0000 [IU] | Freq: Once | INTRAVENOUS | Status: AC
  Administered 2016-05-12: 500 [IU] via INTRAVENOUS
  Filled 2016-05-12: qty 5

## 2016-05-12 NOTE — Progress Notes (Signed)
Pt tolerated hydration well. Spoke with Ricky Crigler PA regarding pt's low B/P, weight loss, poor appetite and drowsiness.. Instructions given to pt and wife for pt to hold his Lisinopril until he comes in Friday and to only take his Dilaudid if he is in pain not on schedule per PA. Pt and wife verbalized understanding. Pt discharged self ambulatory in stable condition with wife

## 2016-05-12 NOTE — Patient Instructions (Signed)
[place] at [facility] Discharge Instructions  RECOMMENDATIONS MADE BY THE CONSULTANT AND ANY TEST RESULTS WILL BE SENT TO YOUR REFERRING PHYSICIAN.  Hydration given today. Follow-up as scheduled. Call clinic for any questions or concerns  Thank you for choosing [place] at [facility] to provide your oncology and hematology care.  To afford each patient quality time with our provider, please arrive at least 15 minutes before your scheduled appointment time.   Beginning January 23rd 2017 lab work for the [facility] will be done in the  Main lab at [facility] on 1st floor. If you have a lab appointment with the [facility] please come in thru the  Main Entrance and check in at the main information desk  You need to re-schedule your appointment should you arrive 10 or more minutes late.  We strive to give you quality time with our providers, and arriving late affects you and other patients whose appointments are after yours.  Also, if you no show three or more times for appointments you may be dismissed from the clinic at the providers discretion.     Again, thank you for choosing [facility].  Our hope is that these requests will decrease the amount of time that you wait before being seen by our physicians.       _____________________________________________________________  Should you have questions after your visit to [facility], please contact our office at [phone] between the hours of 8:30 a.m. and 4:30 p.m.  Voicemails left after 4:30 p.m. will not be returned until the following business day.  For prescription refill requests, have your pharmacy contact our office.         Resources For Cancer Patients and their Caregivers ? American Cancer Society: Can assist with transportation, wigs, general needs, runs Look Good Feel Better.        716-725-8826 ? Cancer Care: Provides financial  assistance, online support groups, medication/co-pay assistance.  1-800-813-HOPE 5646169699) ? [facility] Assists [place] cancer patients and their families through emotional , educational and financial support.  727-012-1116 ? [place] DSS Where to apply for food stamps, Medicaid and utility assistance. 640-524-5662 ? RCATS: Transportation to medical appointments. (903)137-0753 ? Social Security Administration: May apply for disability if have a Stage IV cancer. (681)053-0801 667 788 0068 ? [employer], Disability and Transit Services: Assists with nutrition, care and transit needs. [place] Support Programs: @10RELATIVEDAYS @ > Cancer Support Group  2nd Tuesday of the month 1pm-2pm, Journey Room  > Creative Journey  3rd Tuesday of the month 1130am-1pm, Journey Room  > Look Good Feel Better  1st Wednesday of the month 10am-12 noon, Journey Room (Call [place] to register [phone])

## 2016-05-13 ENCOUNTER — Telehealth (HOSPITAL_COMMUNITY): Payer: Self-pay | Admitting: Emergency Medicine

## 2016-05-13 NOTE — Telephone Encounter (Signed)
Called pts wife to check on him.  Pt has not changed much since fluids were given yesterday.  Has not had any PO pain medication since yesterday.  Had some diarrhea but being controlled with imodium.  I told pt I would call her in the am and check on them and would let Kirby Crigler PA know what was going on.

## 2016-05-14 ENCOUNTER — Encounter (HOSPITAL_COMMUNITY): Payer: Medicare HMO

## 2016-05-14 ENCOUNTER — Telehealth (HOSPITAL_COMMUNITY): Payer: Self-pay | Admitting: Emergency Medicine

## 2016-05-14 ENCOUNTER — Encounter (HOSPITAL_COMMUNITY): Payer: Self-pay | Admitting: Lab

## 2016-05-14 DIAGNOSIS — C259 Malignant neoplasm of pancreas, unspecified: Secondary | ICD-10-CM

## 2016-05-14 DIAGNOSIS — C787 Secondary malignant neoplasm of liver and intrahepatic bile duct: Principal | ICD-10-CM

## 2016-05-14 LAB — CBC WITH DIFFERENTIAL/PLATELET
BASOS PCT: 0 %
Basophils Absolute: 0 10*3/uL (ref 0.0–0.1)
EOS ABS: 0.2 10*3/uL (ref 0.0–0.7)
EOS PCT: 2 %
HCT: 27.5 % — ABNORMAL LOW (ref 39.0–52.0)
Hemoglobin: 9.5 g/dL — ABNORMAL LOW (ref 13.0–17.0)
LYMPHS ABS: 1.1 10*3/uL (ref 0.7–4.0)
Lymphocytes Relative: 11 %
MCH: 30.8 pg (ref 26.0–34.0)
MCHC: 34.5 g/dL (ref 30.0–36.0)
MCV: 89.3 fL (ref 78.0–100.0)
MONO ABS: 1.1 10*3/uL — AB (ref 0.1–1.0)
MONOS PCT: 11 %
Neutro Abs: 7.9 10*3/uL — ABNORMAL HIGH (ref 1.7–7.7)
Neutrophils Relative %: 76 %
Platelets: 109 10*3/uL — ABNORMAL LOW (ref 150–400)
RBC: 3.08 MIL/uL — ABNORMAL LOW (ref 4.22–5.81)
RDW: 16 % — AB (ref 11.5–15.5)
WBC: 10.4 10*3/uL (ref 4.0–10.5)

## 2016-05-14 LAB — COMPREHENSIVE METABOLIC PANEL
ALBUMIN: 3.6 g/dL (ref 3.5–5.0)
ALK PHOS: 129 U/L — AB (ref 38–126)
ALT: 42 U/L (ref 17–63)
AST: 33 U/L (ref 15–41)
Anion gap: 10 (ref 5–15)
BUN: 46 mg/dL — AB (ref 6–20)
CALCIUM: 8.8 mg/dL — AB (ref 8.9–10.3)
CHLORIDE: 94 mmol/L — AB (ref 101–111)
CO2: 24 mmol/L (ref 22–32)
CREATININE: 2.08 mg/dL — AB (ref 0.61–1.24)
GFR calc non Af Amer: 31 mL/min — ABNORMAL LOW (ref >60)
GFR, EST AFRICAN AMERICAN: 36 mL/min — AB (ref >60)
GLUCOSE: 105 mg/dL — AB (ref 65–99)
Potassium: 3.7 mmol/L (ref 3.5–5.1)
SODIUM: 128 mmol/L — AB (ref 135–145)
Total Bilirubin: 0.9 mg/dL (ref 0.3–1.2)
Total Protein: 6.5 g/dL (ref 6.5–8.1)

## 2016-05-14 MED ORDER — MAGIC MOUTHWASH: 5.0000 mL | Freq: Four times a day (QID) | ORAL | 1 refills | Status: AC | PRN

## 2016-05-14 MED ORDER — FLUCONAZOLE 100 MG PO TABS: ORAL_TABLET | ORAL | 0 refills | Status: AC

## 2016-05-14 MED ORDER — ESCITALOPRAM OXALATE 20 MG PO TABS: 20.0000 mg | ORAL_TABLET | Freq: Every day | ORAL | 1 refills | Status: AC

## 2016-05-14 NOTE — Telephone Encounter (Signed)
Called wife to check on pt this am.  Pt is having bad diarrhea that is not responding to imodium.  Wife is having a hard time taking care of pt by herself.  This is taking a tole on her.  She is very open to hospice.  She does not think the pt is.  Pt is going to come in for lab work at 12:40pm today.  And we are going to sit down and have a talk about hospice and how he is feeling.

## 2016-05-14 NOTE — Progress Notes (Unsigned)
Referral sent to [facility].  Records faxed on 8/16.  They will contact patients wife.

## 2016-05-16 ENCOUNTER — Ambulatory Visit (HOSPITAL_COMMUNITY): Payer: Medicare HMO

## 2016-05-20 ENCOUNTER — Ambulatory Visit (HOSPITAL_COMMUNITY): Payer: Medicare HMO

## 2016-05-23 ENCOUNTER — Ambulatory Visit (HOSPITAL_COMMUNITY): Payer: Medicare HMO | Admitting: Hematology & Oncology

## 2016-05-23 ENCOUNTER — Ambulatory Visit (HOSPITAL_COMMUNITY): Payer: Medicare HMO

## 2016-05-25 ENCOUNTER — Encounter (HOSPITAL_COMMUNITY): Payer: Self-pay | Admitting: Hematology & Oncology

## 2016-05-26 ENCOUNTER — Telehealth (HOSPITAL_COMMUNITY): Payer: Self-pay

## 2016-05-26 NOTE — Telephone Encounter (Signed)
Abigail Butts with [facility] called stating wife was worried because patient is more depressed than usual. They want to increase his Lexapro. After reviewing with oncologist, called Abigail Butts back and instructed her to have patient increase Lexapro to 30 mg daily. Abigail Butts verbalized understanding and states she will call prescription in to pharmacy.   [phone]

## 2016-05-30 ENCOUNTER — Ambulatory Visit (HOSPITAL_COMMUNITY): Payer: Medicare HMO

## 2016-06-29 DEATH — deceased

## 2017-01-28 IMAGING — CT CT CHEST W/ CM
2 of 4 series · 15 of 36 positions shown, 18 images · IV contrast (iopamidol)
Comparison: No prior chest exams.  Abdominal CT 03/28/2016.

CLINICAL DATA: Suspected carcinoma of pancreas metastatic to liver.
Pancreatic mass and liver lesions. Evaluate for intrathoracic
metastasis, staging.

EXAM:
CT CHEST WITH CONTRAST
TECHNIQUE: Multidetector CT imaging of the chest was performed during
intravenous contrast administration.
CONTRAST:  75mL [id number] IOPAMIDOL ([id number]) INJECTION 61%

[Series 2: chest with st · axial · 0.88mm/px · z∈[-279,+7]mm · 12 of 167 slices shown, 15 images]
[im 12/167  mediastinal]
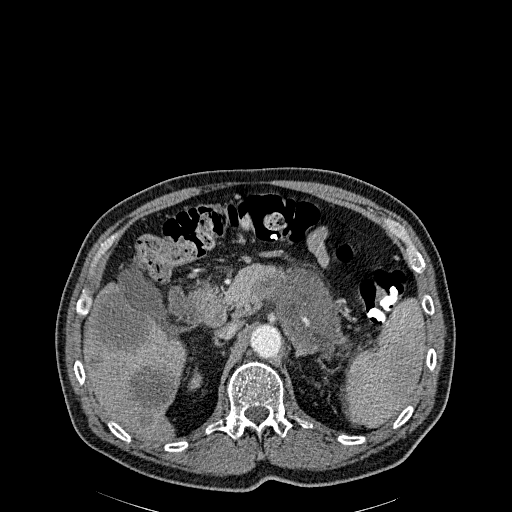
[im 12/167  lung]
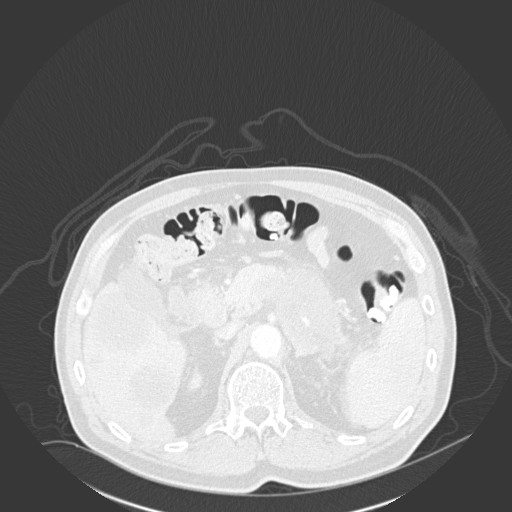
[im 24/167  lung]
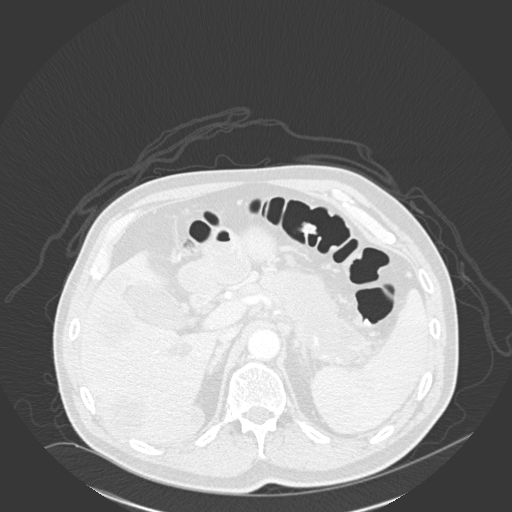
[im 36/167  lung]
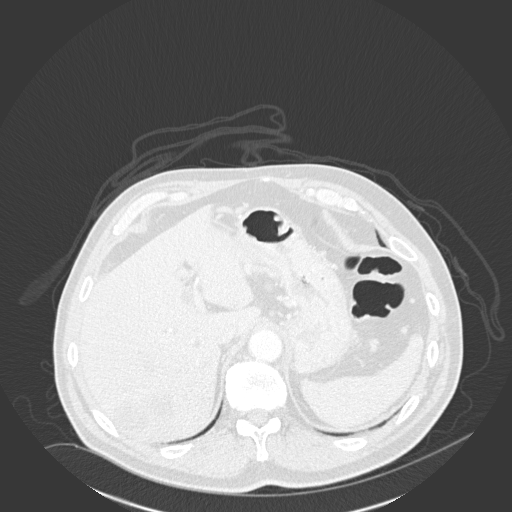
[im 48/167  lung]
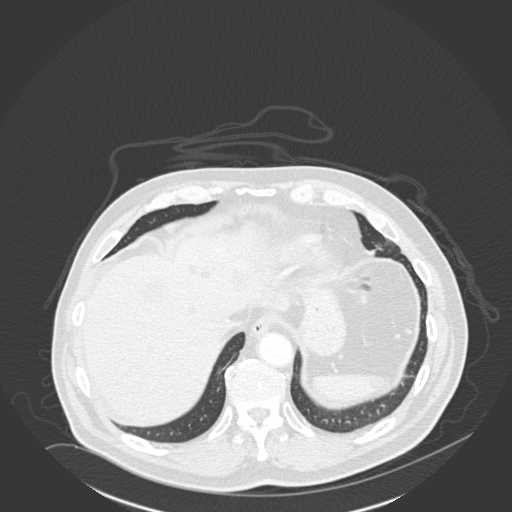
[im 60/167  mediastinal]
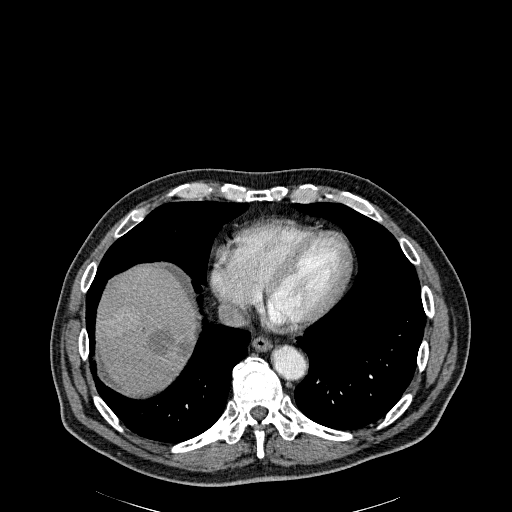
[im 60/167  lung]
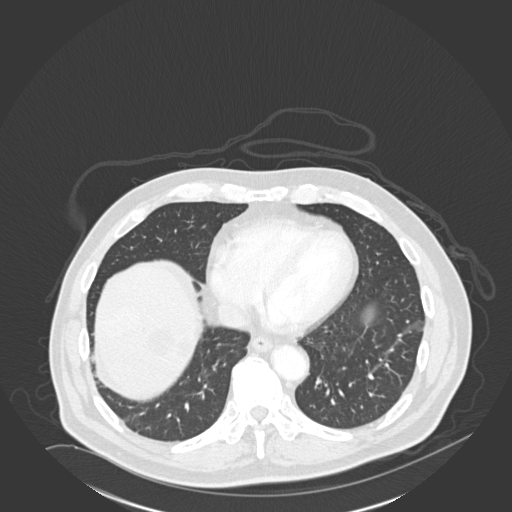
[im 72/167  lung]
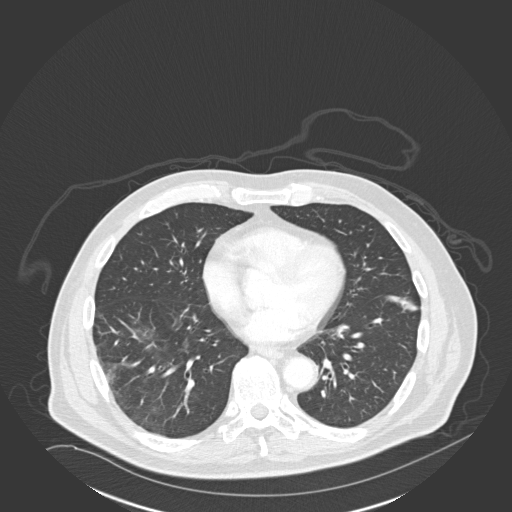
[im 95/167  lung]
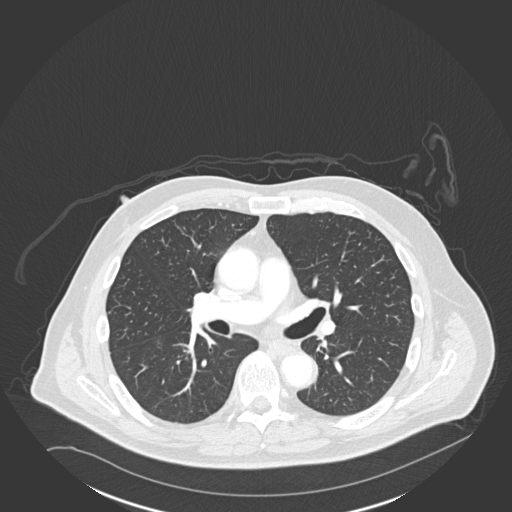
[im 107/167  lung]
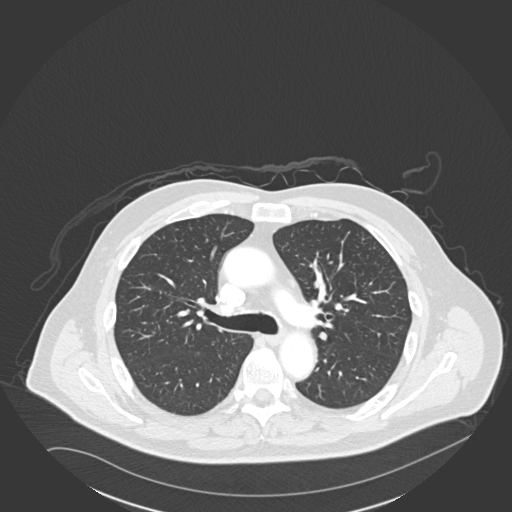
[im 119/167  mediastinal]
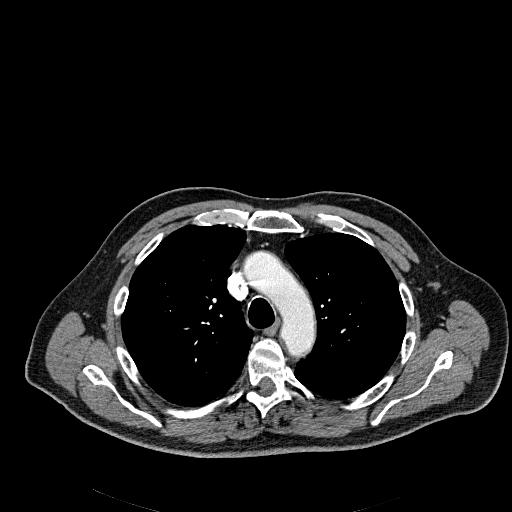
[im 119/167  lung]
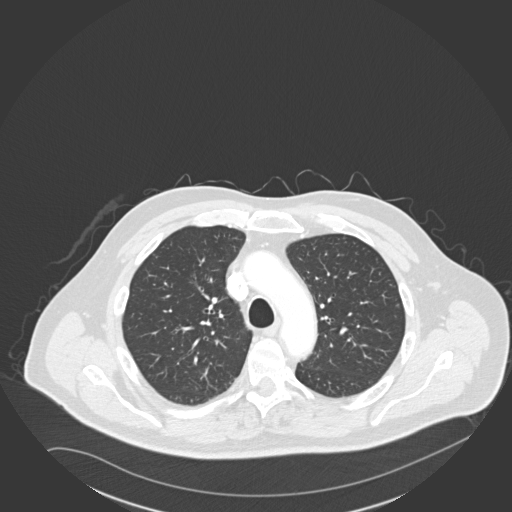
[im 131/167  lung]
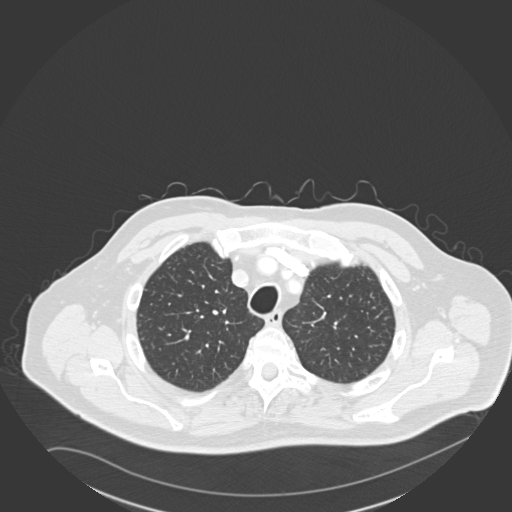
[im 143/167  lung]
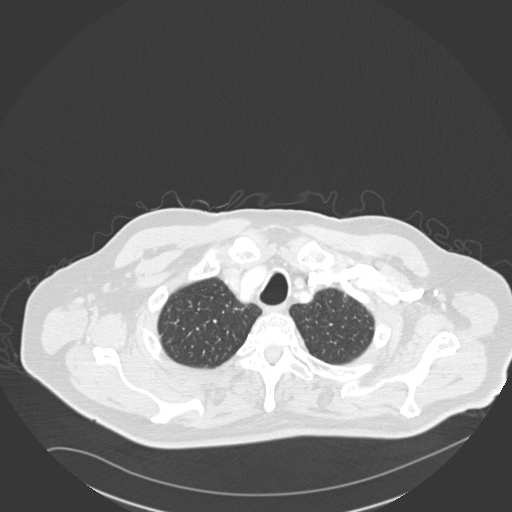
[im 155/167  lung]
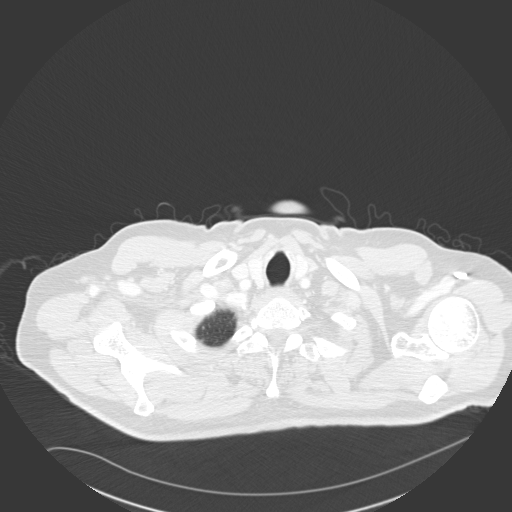

[Series 602: <mpr thick range> · coronal · 0.88mm/px · 3 of 146 slices shown]
[im 30/146  lung]
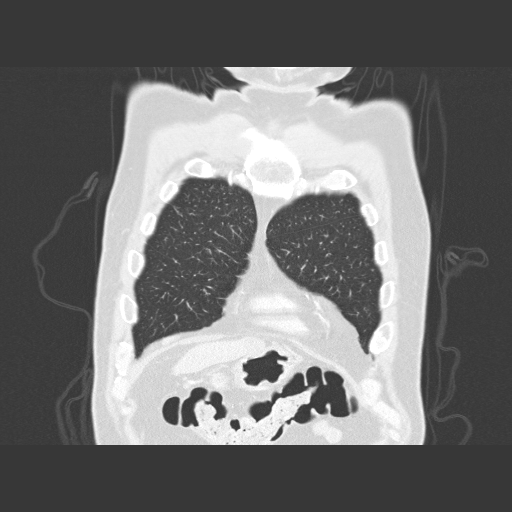
[im 59/146  lung]
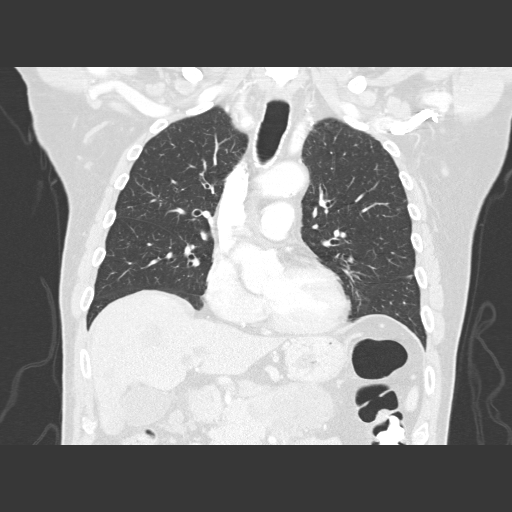
[im 88/146  lung]
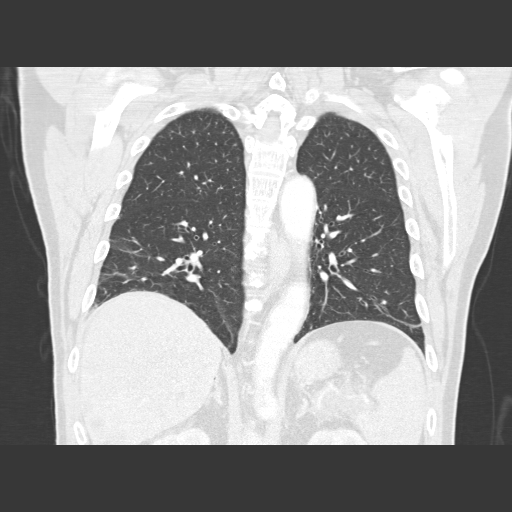

[15 of 36 positions shown; findings below may reference images not displayed]

FINDINGS: Mediastinum/Lymph Nodes: Borderline subcarinal node measures 11 mm
short axis. No additional enlarged hilar or mediastinal lymph nodes.
No mediastinal mass or pericardial fluid. Atherosclerosis of normal
caliber thoracic aorta. Coronary artery calcifications are seen.

Lungs/Pleura: Right lower lobe 6 mm nodule image 100 series 5. 6 mm
nodule right upper lobe image 40 series 5. These are concerning for
metastatic disease. There are tiny subpleural nodules in both upper
lobes, majority less than 2 mm better nonspecific. Linear densities
in both lower lobes and lingular consistent with atelectasis. No
pleural effusion. No confluent airspace disease.

Upper abdomen: Multiple hepatic lesions. Air within the larger
lesion, sequela of percutaneous biopsy earlier today. No evidence of
perihepatic hemorrhage.

Musculoskeletal: Small sclerotic focus within T11 anteriorly. Small
sclerotic focus in the distal left clavicle. There is cortical
thickening evolving multiple vertebral bodies, nonspecific. No
evidence of destructive lytic lesion.
IMPRESSION: 1. Right lower and right upper lobe pulmonary nodules both measuring
6 mm, suspicious for metastatic disease. Multiple tiny subpleural
nodules, upper lobe predominant, are nonspecific, may be
inflammatory or small intrapulmonary lymph nodes, with metastatic
disease not entirely excluded.
2. Prominent subcarinal node measures 11 mm is nonspecific,
otherwise no nodal metastasis.
3. Scattered small sclerotic foci, within T11 and the left clavicle.
Similar sclerotic foci are seen within the abdominal pelvic osseous
structures.

## 2018-11-13 ENCOUNTER — Other Ambulatory Visit: Payer: Self-pay | Admitting: Nurse Practitioner
